# Patient Record
Sex: Male | Born: 1980
Health system: Southern US, Community
[De-identification: ages and names within clinical notes are randomized; demographics above are authoritative.]

## PROBLEM LIST (undated history)

## (undated) DIAGNOSIS — G8929 Other chronic pain: Secondary | ICD-10-CM

## (undated) DIAGNOSIS — F4310 Post-traumatic stress disorder, unspecified: Secondary | ICD-10-CM

## (undated) DIAGNOSIS — M25552 Pain in left hip: Secondary | ICD-10-CM

## (undated) DIAGNOSIS — J45909 Unspecified asthma, uncomplicated: Secondary | ICD-10-CM

## (undated) DIAGNOSIS — F32A Depression, unspecified: Secondary | ICD-10-CM

## (undated) DIAGNOSIS — F329 Major depressive disorder, single episode, unspecified: Secondary | ICD-10-CM

## (undated) DIAGNOSIS — M25551 Pain in right hip: Secondary | ICD-10-CM

## (undated) DIAGNOSIS — M810 Age-related osteoporosis without current pathological fracture: Secondary | ICD-10-CM

## (undated) DIAGNOSIS — F429 Obsessive-compulsive disorder, unspecified: Secondary | ICD-10-CM

## (undated) DIAGNOSIS — M9111 Juvenile osteochondrosis of head of femur [Legg-Calve-Perthes], right leg: Secondary | ICD-10-CM

## (undated) HISTORY — DX: Other chronic pain: G89.29

## (undated) HISTORY — PX: HIP ARTHROSCOPY W/ LABRAL REPAIR: SHX1750

## (undated) HISTORY — DX: Pain in left hip: M25.552

## (undated) HISTORY — DX: Obsessive-compulsive disorder, unspecified: F42.9

## (undated) HISTORY — DX: Juvenile osteochondrosis of head of femur (legg-calve-perthes), right leg: M91.11

## (undated) HISTORY — DX: Depression, unspecified: F32.A

## (undated) HISTORY — DX: Age-related osteoporosis without current pathological fracture: M81.0

## (undated) HISTORY — DX: Pain in right hip: M25.551

## (undated) HISTORY — DX: Post-traumatic stress disorder, unspecified: F43.10

---
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
## 2009-03-02 ENCOUNTER — Emergency Department: Payer: Self-pay | Admitting: Emergency Medicine

## 2009-03-07 ENCOUNTER — Ambulatory Visit: Payer: Self-pay | Admitting: Pain Medicine

## 2012-09-25 ENCOUNTER — Emergency Department: Payer: Self-pay | Admitting: Emergency Medicine

## 2015-04-05 ENCOUNTER — Emergency Department
Admission: EM | Admit: 2015-04-05 | Discharge: 2015-04-05 | Disposition: A | Discharge status: Home / Self Care | Payer: Medicare Other | Attending: Emergency Medicine | Admitting: Emergency Medicine

## 2015-04-05 DIAGNOSIS — K029 Dental caries, unspecified: Secondary | ICD-10-CM | POA: Insufficient documentation

## 2015-04-05 DIAGNOSIS — F17210 Nicotine dependence, cigarettes, uncomplicated: Secondary | ICD-10-CM | POA: Insufficient documentation

## 2015-04-05 DIAGNOSIS — K0381 Cracked tooth: Secondary | ICD-10-CM | POA: Diagnosis not present

## 2015-04-05 DIAGNOSIS — K0889 Other specified disorders of teeth and supporting structures: Secondary | ICD-10-CM | POA: Diagnosis present

## 2015-04-05 HISTORY — DX: Unspecified asthma, uncomplicated: J45.909

## 2015-04-05 MED ORDER — LIDOCAINE VISCOUS 2 % MT SOLN
15.0000 mL | Freq: Once | OROMUCOSAL | Status: AC
  Administered 2015-04-05: 15 mL via OROMUCOSAL
  Filled 2015-04-05: qty 15

## 2015-04-05 MED ORDER — IBUPROFEN 800 MG PO TABS: 800.0000 mg | ORAL_TABLET | Freq: Three times a day (TID) | ORAL | Status: DC | PRN

## 2015-04-05 MED ORDER — LIDOCAINE VISCOUS 2 % MT SOLN: 5.0000 mL | Freq: Four times a day (QID) | OROMUCOSAL | Status: DC | PRN

## 2015-04-05 MED ORDER — AMOXICILLIN 500 MG PO CAPS: 500.0000 mg | ORAL_CAPSULE | Freq: Three times a day (TID) | ORAL | Status: DC

## 2015-04-05 MED ORDER — TRAMADOL HCL 50 MG PO TABS
50.0000 mg | ORAL_TABLET | Freq: Once | ORAL | Status: AC
Start: 2015-04-05 — End: 2015-04-05
  Administered 2015-04-05: 50 mg via ORAL
  Filled 2015-04-05: qty 1

## 2015-04-05 MED ORDER — TRAMADOL HCL 50 MG PO TABS: 50.0000 mg | ORAL_TABLET | Freq: Four times a day (QID) | ORAL | Status: AC | PRN

## 2015-04-05 MED ORDER — IBUPROFEN 800 MG PO TABS
800.0000 mg | ORAL_TABLET | Freq: Once | ORAL | Status: DC
  Filled 2015-04-05: qty 1

## 2015-04-05 NOTE — Discharge Instructions (Signed)

## 2015-04-05 NOTE — ED Provider Notes (Signed)
[facility] Emergency Department Provider Note  ____________________________________________  Time seen: Approximately 2:16 PM  I have reviewed the triage vital signs and the nursing notes.   HISTORY  Chief Complaint Dental Pain    HPI Charles Harrison is a 35 y.o. male patient complaining of dental pain for 4-5 days. Patient has history of dental caries and fractured tooth to the left. Lower molars. Patient denies any fever associated this complaint. Patient stated he noticed some mild swelling to the left lateral mandible. No palliative measures taken this complaint. Patient wife said he has appointment next week for dentist. Patient rated his pain as a 7/10. Describes pain as sharp.   Past Medical History  Diagnosis Date  . Asthma     There are no active problems to display for this patient.   Past Surgical History  Procedure Laterality Date  . Hip arthroscopy w/ labral repair      BL    Current Outpatient Rx  Name  Route  Sig  Dispense  Refill  . amoxicillin (AMOXIL) 500 MG capsule   Oral   Take 1 capsule (500 mg total) by mouth 3 (three) times daily.   30 capsule   0   . ibuprofen (ADVIL,MOTRIN) 800 MG tablet   Oral   Take 1 tablet (800 mg total) by mouth every 8 (eight) hours as needed.   30 tablet   0   . lidocaine (XYLOCAINE) 2 % solution   Mouth/Throat   Use as directed 5 mLs in the mouth or throat every 6 (six) hours as needed for mouth pain. For oral swish   100 mL   0   . traMADol (ULTRAM) 50 MG tablet   Oral   Take 1 tablet (50 mg total) by mouth every 6 (six) hours as needed.   20 tablet   0     Allergies Review of patient's allergies indicates no known allergies.  No family history on file.  Social History Social History  Substance Use Topics  . Smoking status: Current Every Day Smoker    Types: Cigarettes  . Smokeless tobacco: None  . Alcohol Use: No    Review of Systems Constitutional: No  fever/chills Eyes: No visual changes. ENT: No sore throat. Dental pain Cardiovascular: Denies chest pain. Respiratory: Denies shortness of breath. Gastrointestinal: No abdominal pain.  No nausea, no vomiting.  No diarrhea.  No constipation. Genitourinary: Negative for dysuria. Musculoskeletal: Negative for back pain. Skin: Negative for rash. Neurological: Negative for headaches, focal weakness or numbness. .  ____________________________________________   PHYSICAL EXAM:  VITAL SIGNS: ED Triage Vitals  Enc Vitals Group     BP 04/05/15 1338 136/91 mmHg     Pulse Rate 04/05/15 1338 64     Resp 04/05/15 1338 17     Temp 04/05/15 1338 98 F (36.7 C)     Temp Source 04/05/15 1338 Oral     SpO2 04/05/15 1338 99 %     Weight 04/05/15 1338 145 lb (65.772 kg)     Height 04/05/15 1338 5\' 7"  (1.702 m)     Head Cir --      Peak Flow --      Pain Score 04/05/15 1339 7     Pain Loc --      Pain Edu? --      Excl. in GC? --     Constitutional: Alert and oriented. Well appearing and in no acute distress. Eyes: Conjunctivae are normal. PERRL. EOMI. Head:  Atraumatic. Nose: No congestion/rhinnorhea. Mouth/Throat: Mucous membranes are moist.  Oropharynx non-erythematous. Fractured tooth #17. Devitalize tooth #18 and 19. Neck: No stridor.  No cervical spine tenderness to palpation. Hematological/Lymphatic/Immunilogical: No cervical lymphadenopathy. Cardiovascular: Normal rate, regular rhythm. Grossly normal heart sounds.  Good peripheral circulation. Respiratory: Normal respiratory effort.  No retractions. Lungs CTAB. Gastrointestinal: Soft and nontender. No distention. No abdominal bruits. No CVA tenderness. Musculoskeletal: No lower extremity tenderness nor edema.  No joint effusions. Neurologic:  Normal speech and language. No gross focal neurologic deficits are appreciated. No gait instability. Skin:  Skin is warm, dry and intact. No rash noted. Psychiatric: Mood and affect are  normal. Speech and behavior are normal.  ____________________________________________   LABS (all labs ordered are listed, but only abnormal results are displayed)  Labs Reviewed - No data to display ____________________________________________  EKG   ____________________________________________  RADIOLOGY   ____________________________________________   PROCEDURES  Procedure(s) performed: None  Critical Care performed: No  ____________________________________________   INITIAL IMPRESSION / ASSESSMENT AND PLAN / ED COURSE  Pertinent labs & imaging results that were available during my care of the patient were reviewed by me and considered in my medical decision making (see chart for details).  Dental pain secondary to tooth fracture and devitalized teeth. Patient given a prescription for tramadol, ibuprofen, amoxicillin. Patient advised to follow-up with scheduled an appointment next week. ____________________________________________   FINAL CLINICAL IMPRESSION(S) / ED DIAGNOSES  Final diagnoses:  Pain due to dental caries      Joni Reining, PA-C 04/05/15 1422  Sharyn Creamer, MD 04/05/15 1544

## 2015-04-05 NOTE — ED Notes (Signed)
Pt c/o left lower toothache for the past 4-5 days

## 2017-04-05 ENCOUNTER — Emergency Department
Admission: EM | Admit: 2017-04-05 | Discharge: 2017-04-05 | Disposition: A | Discharge status: Home / Self Care | Payer: Medicare Other | Attending: Emergency Medicine | Admitting: Emergency Medicine

## 2017-04-05 ENCOUNTER — Encounter: Payer: Self-pay | Admitting: Emergency Medicine

## 2017-04-05 ENCOUNTER — Other Ambulatory Visit: Payer: Self-pay

## 2017-04-05 DIAGNOSIS — N4829 Other inflammatory disorders of penis: Secondary | ICD-10-CM | POA: Diagnosis not present

## 2017-04-05 DIAGNOSIS — F17210 Nicotine dependence, cigarettes, uncomplicated: Secondary | ICD-10-CM | POA: Diagnosis not present

## 2017-04-05 DIAGNOSIS — N4889 Other specified disorders of penis: Secondary | ICD-10-CM | POA: Insufficient documentation

## 2017-04-05 DIAGNOSIS — J45909 Unspecified asthma, uncomplicated: Secondary | ICD-10-CM | POA: Insufficient documentation

## 2017-04-05 LAB — URINALYSIS, COMPLETE (UACMP) WITH MICROSCOPIC
BACTERIA UA: NONE SEEN
BILIRUBIN URINE: NEGATIVE
Glucose, UA: NEGATIVE mg/dL
Hgb urine dipstick: NEGATIVE
Ketones, ur: NEGATIVE mg/dL
LEUKOCYTES UA: NEGATIVE
Nitrite: NEGATIVE
PROTEIN: NEGATIVE mg/dL
RBC / HPF: NONE SEEN RBC/hpf (ref 0–5)
Specific Gravity, Urine: 1.012 (ref 1.005–1.030)
pH: 6 (ref 5.0–8.0)

## 2017-04-05 NOTE — Discharge Instructions (Signed)
Please follow up with urology as we discussed. Avoid intercourse/masturbation/erections until pain has resolved

## 2017-04-05 NOTE — ED Provider Notes (Signed)
[facility] Emergency Department Provider Note   ____________________________________________    I have reviewed the triage vital signs and the nursing notes.   HISTORY  Chief Complaint Penis pain    HPI Charles Harrison is a 37 y.o. male who reports that 5 days ago he was wearing a "penis ring "to sustain an erection and apparently fell asleep without taking it off.  He woke up several hours later with mild swelling to the shaft of the penis.  He was able to get the ring off.  Since then he has had mild burning with urination and some soreness with erections.  No hematuria noted.  No penile discharge.  No significant swelling or bruising of the penis.  He is able to sustain an erection although it is uncomfortable  Past Medical History:  Diagnosis Date  . Asthma     There are no active problems to display for this patient.   Past Surgical History:  Procedure Laterality Date  . HIP ARTHROSCOPY W/ LABRAL REPAIR     BL    Prior to Admission medications   Medication Sig Start Date End Date Taking? Authorizing Provider  amoxicillin (AMOXIL) 500 MG capsule Take 1 capsule (500 mg total) by mouth 3 (three) times daily. 04/05/15   Joni ReiningSmith, Ronald K, PA-C  ibuprofen (ADVIL,MOTRIN) 800 MG tablet Take 1 tablet (800 mg total) by mouth every 8 (eight) hours as needed. 04/05/15   Joni ReiningSmith, Ronald K, PA-C  lidocaine (XYLOCAINE) 2 % solution Use as directed 5 mLs in the mouth or throat every 6 (six) hours as needed for mouth pain. For oral swish 04/05/15   Joni ReiningSmith, Ronald K, PA-C     Allergies Patient has no known allergies.  No family history on file.  Social History Social History   Tobacco Use  . Smoking status: Current Every Day Smoker    Types: Cigarettes  . Smokeless tobacco: Never Used  Substance Use Topics  . Alcohol use: No  . Drug use: Not on file    Review of Systems  Constitutional: No fever/chills Eyes: No visual changes.  ENT: No sore  throat. Cardiovascular: Denies palpitations Respiratory: Denies shortness of breath. Gastrointestinal: No abdominal pain.  Genitourinary: As above Musculoskeletal: Negative for back pain. Skin: Negative for bruising Neurological: Negative for numbness   ____________________________________________   PHYSICAL EXAM:  VITAL SIGNS: ED Triage Vitals  Enc Vitals Group     BP 04/05/17 1131 118/71     Pulse Rate 04/05/17 1131 86     Resp 04/05/17 1131 16     Temp 04/05/17 1131 98.1 F (36.7 C)     Temp Source 04/05/17 1131 Oral     SpO2 04/05/17 1131 99 %     Weight 04/05/17 1124 68 kg (150 lb)     Height 04/05/17 1124 1.676 m (5\' 6" )     Head Circumference --      Peak Flow --      Pain Score 04/05/17 1123 0     Pain Loc --      Pain Edu? --      Excl. in GC? --     Constitutional: Alert and oriented. No acute distress. Pleasant and interactive  Nose: No congestion/rhinnorhea.  Cardiovascular: Normal rate, regular rhythm.   Good peripheral circulation. Respiratory: Normal respiratory effort.  No retractions.  Gastrointestinal: Soft and nontender. No distention.  No CVA tenderness. Genitourinary: Shaft of penis not swollen, no bruising, no penile discharge, scrotum normal  musculoskeletal:   Warm and well perfused Neurologic:  Normal speech and language. No gross focal neurologic deficits are appreciated.  Skin:  Skin is warm, dry and intact. No rash noted. Psychiatric: Mood and affect are normal. Speech and behavior are normal.  ____________________________________________   LABS (all labs ordered are listed, but only abnormal results are displayed)  Labs Reviewed  URINALYSIS, COMPLETE (UACMP) WITH MICROSCOPIC - Abnormal; Notable for the following components:      Result Value   Color, Urine YELLOW (*)    APPearance CLEAR (*)    Squamous Epithelial / LPF 0-5 (*)    All other components within normal limits    ____________________________________________  EKG  None ____________________________________________  RADIOLOGY  None ____________________________________________   PROCEDURES  Procedure(s) performed: No  Procedures   Critical Care performed: No ____________________________________________   INITIAL IMPRESSION / ASSESSMENT AND PLAN / ED COURSE  Pertinent labs & imaging results that were available during my care of the patient were reviewed by me and considered in my medical decision making (see chart for details).  Exam is overall reassuring.  Suspect mild inflammation of the pain is causing his pain will refer him to urology for thorough evaluation..  Urinalysis unremarkable   ____________________________________________   FINAL CLINICAL IMPRESSION(S) / ED DIAGNOSES  Final diagnoses:  Inflammation of penis        Note:  This document was prepared using Dragon voice recognition software and may include unintentional dictation errors.    Jene Every, MD 04/05/17 1401
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
## 2017-04-05 NOTE — ED Triage Notes (Signed)
States accidentally fell asleep with a penis ring on (last Wednesday January 9th).  Since that time patient c/o discomfort with urination, difficult to "push out urine",and pain with erection.  States since injury he has not been able to have a full erection.

## 2018-05-27 ENCOUNTER — Encounter: Payer: Self-pay | Admitting: Family Medicine

## 2018-05-27 ENCOUNTER — Ambulatory Visit (INDEPENDENT_AMBULATORY_CARE_PROVIDER_SITE_OTHER): Payer: Medicare Other | Admitting: Family Medicine

## 2018-05-27 ENCOUNTER — Encounter (INDEPENDENT_AMBULATORY_CARE_PROVIDER_SITE_OTHER): Payer: Self-pay

## 2018-05-27 VITALS — BP 90/60 | HR 82 | Temp 98.2°F | Ht 67.0 in | Wt 161.4 lb

## 2018-05-27 DIAGNOSIS — B372 Candidiasis of skin and nail: Secondary | ICD-10-CM | POA: Diagnosis not present

## 2018-05-27 DIAGNOSIS — L0291 Cutaneous abscess, unspecified: Secondary | ICD-10-CM | POA: Diagnosis not present

## 2018-05-27 MED ORDER — FLUCONAZOLE 150 MG PO TABS: 150.0000 mg | ORAL_TABLET | Freq: Every day | ORAL | 0 refills | Status: AC

## 2018-05-27 MED ORDER — SULFAMETHOXAZOLE-TRIMETHOPRIM 800-160 MG PO TABS: 1.0000 | ORAL_TABLET | Freq: Two times a day (BID) | ORAL | 0 refills | Status: DC

## 2018-05-27 NOTE — Progress Notes (Signed)
Subjective:    Patient ID: Charles Harrison, male    DOB: Apr 03, 1980, 38 y.o.   MRN: [id number]  HPI  Patient presents to clinic to establish with PCP.  Main concern today is infection of skin on left buttock, and also a rash/jock itch in his groin.  Denies fever or chills.  States he has had the abscess on his buttock flareup before, it has drained pus, and then would go away.  States this time it did drain some pus yesterday, but skin has continued to remain red and sore.  Patient has had rash in groin off and on for many years.  Has tried different over-the-counter antifungal creams on occasion, but nothing consistently.  Does not use powder.  Past medical, surgical, family, social history reviewed and updated accordingly in chart.  Past Medical History:  Diagnosis Date  . Asthma   . PTSD (post-traumatic stress disorder)    There are no active problems to display for this patient.  Social History   Tobacco Use  . Smoking status: Former Smoker    Last attempt to quit: 07/15/2017    Years since quitting: 0.8  . Smokeless tobacco: Never Used  Substance Use Topics  . Alcohol use: No   Past Surgical History:  Procedure Laterality Date  . HIP ARTHROSCOPY W/ LABRAL REPAIR     BL   History reviewed. No pertinent family history.  Review of Systems  Constitutional: Negative for chills, fatigue and fever.  HENT: Negative for congestion, ear pain, sinus pain and sore throat.   Eyes: Negative.   Respiratory: Negative for cough, shortness of breath and wheezing.   Cardiovascular: Negative for chest pain, palpitations and leg swelling.  Gastrointestinal: Negative for abdominal pain, diarrhea, nausea and vomiting.  Genitourinary: Negative for dysuria, frequency and urgency.  Musculoskeletal: Negative for arthralgias and myalgias.  Skin: +cyst on buttock. +jock itch/rash Neurological: Negative for syncope, light-headedness and headaches.  Psychiatric/Behavioral: The patient is not  nervous/anxious.       Objective:   Physical Exam  Constitutional: He appears well-developed and well-nourished. No distress.  HENT:  Head: Normocephalic and atraumatic.  Eyes: Pupils are equal, round, and reactive to light. Conjunctivae and EOM are normal. No scleral icterus.  Neck: Normal range of motion. Neck supple. No tracheal deviation present.  Cardiovascular: Normal rate, regular rhythm and normal heart sounds.  Pulmonary/Chest: Effort normal and breath sounds normal. No respiratory distress. He has no wheezes. He has no rales.  Abdominal: Soft. Bowel sounds are normal. There is no tenderness.  Neurological: He is alert and oriented to person, place, and time.  Gait normal  Skin: Small abscess on left buttock cheek, skin is slightly red, small opening draining clear fluid.  Fungal type rash in bilateral groin area. Psychiatric: He has a normal mood and affect. His behavior is normal. Thought content normal.   Nursing note and vitals reviewed.    Today's Vitals   05/27/18 1414  BP: 90/60  Pulse: 82  Temp: 98.2 F (36.8 C)  TempSrc: Oral  Weight: 161 lb 6.4 oz (73.2 kg)  Height: 5\' 7"  (1.702 m)   Body mass index is 25.28 kg/m.     Assessment & Plan:   Yeast infection of skin in groin- patient's rash in groin appears consistent with a fungal yeast type rash.  Patient will use over-the-counter ketoconazole cream twice daily for 1 week, then switch over to antifungal powder such as Goldbond.  He will also take oral Diflucan course.  Advised to wear cotton underwear.  Abscess of skin on left buttock- patient will take Bactrim twice daily for 10 days.  Abscess area should clear up.  If it recurs, we discussed possibility of needing general surgery referral for cyst sac removal.  Patient will follow-up here in approximately 2 weeks for complete physical exam with fasting blood work.  Patient is aware he can return to clinic sooner if any issues arise.

## 2018-05-31 ENCOUNTER — Encounter: Payer: Self-pay | Admitting: Family Medicine

## 2018-06-10 ENCOUNTER — Encounter: Payer: Self-pay | Admitting: Family Medicine

## 2018-06-10 ENCOUNTER — Ambulatory Visit (INDEPENDENT_AMBULATORY_CARE_PROVIDER_SITE_OTHER): Payer: Medicare Other | Admitting: Family Medicine

## 2018-06-10 ENCOUNTER — Other Ambulatory Visit: Payer: Self-pay

## 2018-06-10 VITALS — BP 102/68 | HR 79 | Temp 98.2°F | Resp 16 | Ht 67.0 in | Wt 161.2 lb

## 2018-06-10 DIAGNOSIS — Z1329 Encounter for screening for other suspected endocrine disorder: Secondary | ICD-10-CM

## 2018-06-10 DIAGNOSIS — F4310 Post-traumatic stress disorder, unspecified: Secondary | ICD-10-CM | POA: Diagnosis not present

## 2018-06-10 DIAGNOSIS — L0291 Cutaneous abscess, unspecified: Secondary | ICD-10-CM

## 2018-06-10 DIAGNOSIS — Z0000 Encounter for general adult medical examination without abnormal findings: Secondary | ICD-10-CM | POA: Diagnosis not present

## 2018-06-10 DIAGNOSIS — M25552 Pain in left hip: Secondary | ICD-10-CM

## 2018-06-10 DIAGNOSIS — B372 Candidiasis of skin and nail: Secondary | ICD-10-CM | POA: Diagnosis not present

## 2018-06-10 DIAGNOSIS — M9111 Juvenile osteochondrosis of head of femur [Legg-Calve-Perthes], right leg: Secondary | ICD-10-CM | POA: Insufficient documentation

## 2018-06-10 DIAGNOSIS — Z13220 Encounter for screening for lipoid disorders: Secondary | ICD-10-CM | POA: Diagnosis not present

## 2018-06-10 DIAGNOSIS — M25551 Pain in right hip: Secondary | ICD-10-CM

## 2018-06-10 DIAGNOSIS — G8929 Other chronic pain: Secondary | ICD-10-CM | POA: Insufficient documentation

## 2018-06-10 LAB — LIPID PANEL
CHOLESTEROL: 216 mg/dL — AB (ref 0–200)
HDL: 42.9 mg/dL (ref >39.00)
LDL CALC: 143 mg/dL — AB (ref 0–99)
NonHDL: 172.78
TRIGLYCERIDES: 151 mg/dL — AB (ref 0.0–149.0)
Total CHOL/HDL Ratio: 5
VLDL: 30.2 mg/dL (ref 0.0–40.0)

## 2018-06-10 LAB — CBC
HEMATOCRIT: 41.8 % (ref 39.0–52.0)
HEMOGLOBIN: 14.5 g/dL (ref 13.0–17.0)
MCHC: 34.8 g/dL (ref 30.0–36.0)
MCV: 97.4 fl (ref 78.0–100.0)
Platelets: 244 10*3/uL (ref 150.0–400.0)
RBC: 4.29 Mil/uL (ref 4.22–5.81)
RDW: 12.8 % (ref 11.5–15.5)
WBC: 4.9 10*3/uL (ref 4.0–10.5)

## 2018-06-10 LAB — COMPREHENSIVE METABOLIC PANEL
ALBUMIN: 4.4 g/dL (ref 3.5–5.2)
ALK PHOS: 62 U/L (ref 39–117)
ALT: 32 U/L (ref 0–53)
AST: 22 U/L (ref 0–37)
BUN: 13 mg/dL (ref 6–23)
CALCIUM: 8.9 mg/dL (ref 8.4–10.5)
CHLORIDE: 103 meq/L (ref 96–112)
CO2: 24 mEq/L (ref 19–32)
Creatinine, Ser: 1 mg/dL (ref 0.40–1.50)
GFR: 83.38 mL/min (ref >60.00)
Glucose, Bld: 109 mg/dL — ABNORMAL HIGH (ref 70–99)
POTASSIUM: 3.5 meq/L (ref 3.5–5.1)
SODIUM: 136 meq/L (ref 135–145)
TOTAL PROTEIN: 7.3 g/dL (ref 6.0–8.3)
Total Bilirubin: 0.5 mg/dL (ref 0.2–1.2)

## 2018-06-10 NOTE — Progress Notes (Signed)
Subjective:    Patient ID: Charles Harrison, male    DOB: 08/22/1980, 38 y.o.   MRN: [id number]   HPI  Patient resents to clinic for complete physical exam and also for follow-up on abscess and yeast infection of skin.  Patient has completed Bactrim course, abscess on buttock cheeks has completely resolved.  Patient followed my advice and use over-the-counter ketoconazole cream twice daily for 1 week and then switched over to antifungal powder Goldbond, and that this has worked Adult nurse to help keep groin rash under control.  He tolerated the short Diflucan course without any issues.  Patient is pleased the yeast infection/groin rash improved.  Otherwise patient is feeling well, reports no new issues.  Past medical, social, surgical and family history again reviewed and updated accordingly in chart.  Past Medical History:  Diagnosis Date  . Asthma   . Chronic pain of both hips   . Legg-Calve-Perthes disease, right   . PTSD (post-traumatic stress disorder)    Patient Active Problem List   Diagnosis Date Noted  . PTSD (post-traumatic stress disorder)   . Chronic pain of both hips   . Legg-Calve-Perthes disease, right    Social History   Tobacco Use  . Smoking status: Former Smoker    Last attempt to quit: 07/15/2017    Years since quitting: 0.9  . Smokeless tobacco: Never Used  Substance Use Topics  . Alcohol use: No   Family History  Family history unknown: Yes    Review of Systems  Constitutional: Negative for chills, fatigue and fever.  HENT: Negative for congestion, ear pain, sinus pain and sore throat.   Eyes: Negative.   Respiratory: Negative for cough, shortness of breath and wheezing.   Cardiovascular: Negative for chest pain, palpitations and leg swelling.  Gastrointestinal: Negative for abdominal pain, diarrhea, nausea and vomiting.  Genitourinary: Negative for dysuria, frequency and urgency.  Musculoskeletal: Negative for arthralgias and myalgias.  Skin:  Negative for color change, pallor and rash.  Neurological: Negative for syncope, light-headedness and headaches.  Psychiatric/Behavioral: The patient is not nervous/anxious.       Objective:   Physical Exam Vitals signs and nursing note reviewed.  Constitutional:      General: He is not in acute distress.    Appearance: He is not toxic-appearing.  HENT:     Head: Normocephalic and atraumatic.     Right Ear: Tympanic membrane, ear canal and external ear normal. There is no impacted cerumen.     Left Ear: Tympanic membrane, ear canal and external ear normal. There is no impacted cerumen.     Nose: Nose normal.     Mouth/Throat:     Mouth: Mucous membranes are moist.     Pharynx: Oropharynx is clear.  Eyes:     General: No scleral icterus.    Extraocular Movements: Extraocular movements intact.     Pupils: Pupils are equal, round, and reactive to light.  Neck:     Musculoskeletal: Normal range of motion and neck supple. No neck rigidity.  Cardiovascular:     Rate and Rhythm: Normal rate and regular rhythm.     Heart sounds: Normal heart sounds.  Pulmonary:     Effort: Pulmonary effort is normal. No respiratory distress.     Breath sounds: Normal breath sounds. No wheezing, rhonchi or rales.  Abdominal:     General: Bowel sounds are normal. There is no distension.     Palpations: Abdomen is soft.     Tenderness: There  is no abdominal tenderness. There is no guarding or rebound.     Hernia: No hernia is present.  Genitourinary:    Penis: Normal.      Scrotum/Testes: Normal.  Skin:    General: Skin is warm and dry.     Coloration: Skin is not jaundiced or pale.     Findings: No erythema or rash.     Comments: Abscess on buttock resolved Rash in groin resolved  Neurological:     Mental Status: He is alert and oriented to person, place, and time.     Comments: RLE weakness, chronic issue, on disability  Psychiatric:        Mood and Affect: Mood normal.        Behavior:  Behavior normal.    Today's Vitals   06/10/18 1042  BP: 102/68  Pulse: 79  Resp: 16  Temp: 98.2 F (36.8 C)  TempSrc: Oral  SpO2: 98%  Weight: 161 lb 3.2 oz (73.1 kg)  Height: 5\' 7"  (1.702 m)   Body mass index is 25.25 kg/m.     Depression screen PHQ 2/9 06/10/2018  Decreased Interest 0  Down, Depressed, Hopeless 0  PHQ - 2 Score 0    Assessment & Plan:    Well adult exam, lipid screen, thyroid screen-we will get blood work including CBC, CMP, lipid panel thyroid panel.  Discussed healthy diet full of lean proteins, lots of vegetables, carbs in moderation, plenty of water and encouraged regular physical activity including walking and small exercises he can do at home to keep himself physically active.  Patient always wears seatbelt when in car.  Recommended he see dentist twice per year and see eye doctor at least every 2 years.  Discussed safe sun practices including wearing wide brim hat, long sleeves and SPF of at least 30 when outdoors for extended period of time.  Abscess-abscess on buttocks has resolved completely after Bactrim course.  Patient will monitor for any recurrence.  Skin yeast infection-rash in groin area has resolved after use of ketoconazole cream for 1 week then switching over to Goldbond topical powder.  Encouraged to continue use of powder in the groin area to avoid sweat buildup which in turn often will lead to a fungal type rash.  Patient will return to clinic annually for wellness exam, and return to clinic sooner if any illness or issues arise.  He is aware he can call office at any time if needed.

## 2018-06-11 LAB — THYROID PANEL WITH TSH
Free Thyroxine Index: 2.4 (ref 1.4–3.8)
T3 UPTAKE: 32 % (ref 22–35)
T4 TOTAL: 7.4 ug/dL (ref 4.9–10.5)
TSH: 2.29 mIU/L (ref 0.40–4.50)

## 2018-06-16 ENCOUNTER — Encounter: Payer: Self-pay | Admitting: Lab

## 2018-06-21 ENCOUNTER — Telehealth: Payer: Self-pay | Admitting: Family Medicine

## 2018-06-21 NOTE — Telephone Encounter (Signed)
Pt given results as noted below, patient verbalized understanding. Unable to document in result note.   Notes recorded by Tracey Harries, FNP on 06/14/2018 at 8:57 AM EDT Complete blood count is normal. Thyroid normal. Cholesterol is a little elevated, work on good diet and regular exercise to help improve cholesterol. Electrolytes, liver and kidney normal

## 2019-03-25 ENCOUNTER — Other Ambulatory Visit: Payer: Self-pay

## 2019-03-25 ENCOUNTER — Emergency Department
Admission: EM | Admit: 2019-03-25 | Discharge: 2019-03-25 | Disposition: A | Discharge status: Home / Self Care | Payer: Medicare Other | Attending: Emergency Medicine | Admitting: Emergency Medicine

## 2019-03-25 ENCOUNTER — Encounter: Payer: Self-pay | Admitting: Emergency Medicine

## 2019-03-25 DIAGNOSIS — Z87891 Personal history of nicotine dependence: Secondary | ICD-10-CM | POA: Diagnosis not present

## 2019-03-25 DIAGNOSIS — Z20822 Contact with and (suspected) exposure to covid-19: Secondary | ICD-10-CM | POA: Diagnosis not present

## 2019-03-25 DIAGNOSIS — Z79899 Other long term (current) drug therapy: Secondary | ICD-10-CM | POA: Diagnosis not present

## 2019-03-25 DIAGNOSIS — J029 Acute pharyngitis, unspecified: Secondary | ICD-10-CM | POA: Diagnosis present

## 2019-03-25 DIAGNOSIS — J069 Acute upper respiratory infection, unspecified: Secondary | ICD-10-CM | POA: Insufficient documentation

## 2019-03-25 DIAGNOSIS — J45909 Unspecified asthma, uncomplicated: Secondary | ICD-10-CM | POA: Diagnosis not present

## 2019-03-25 DIAGNOSIS — B9789 Other viral agents as the cause of diseases classified elsewhere: Secondary | ICD-10-CM | POA: Diagnosis not present

## 2019-03-25 LAB — SARS CORONAVIRUS 2 (TAT 6-24 HRS): SARS Coronavirus 2: NEGATIVE

## 2019-03-25 MED ORDER — CETIRIZINE HCL 10 MG PO TABS: 10.0000 mg | ORAL_TABLET | Freq: Every day | ORAL | 2 refills | Status: AC

## 2019-03-25 MED ORDER — FLUTICASONE PROPIONATE 50 MCG/ACT NA SUSP: 1.0000 | Freq: Every day | NASAL | 2 refills | Status: AC

## 2019-03-25 MED ORDER — ALBUTEROL SULFATE HFA 108 (90 BASE) MCG/ACT IN AERS: 2.0000 | INHALATION_SPRAY | Freq: Four times a day (QID) | RESPIRATORY_TRACT | 1 refills | Status: AC | PRN

## 2019-03-25 NOTE — ED Triage Notes (Signed)
Sore throat and cough today. States daughter has COVID.

## 2019-03-25 NOTE — ED Provider Notes (Signed)
Emergency Department Provider Note  ____________________________________________  Time seen: Approximately 3:58 PM  I have reviewed the triage vital signs and the nursing notes.   HISTORY  Chief Complaint Sore Throat and Cough   Historian Patient    HPI Charles Harrison is a 39 y.o. male with a history of asthma and chronic pain, presents to the emergency department with pharyngitis, dry nonproductive cough and malaise that started today.  Patient's daughter recently tested positive for COVID-19.  He denies chest pain abdominal pain, diarrhea or vomiting.  He has been afebrile at home.  No associated nasal congestion or rhinorrhea.  No other alleviating measures have been attempted.   Past Medical History:  Diagnosis Date  . Asthma   . Chronic pain of both hips   . Legg-Calve-Perthes disease, right   . PTSD (post-traumatic stress disorder)      Immunizations up to date:  Yes.     Past Medical History:  Diagnosis Date  . Asthma   . Chronic pain of both hips   . Legg-Calve-Perthes disease, right   . PTSD (post-traumatic stress disorder)     Patient Active Problem List   Diagnosis Date Noted  . PTSD (post-traumatic stress disorder)   . Chronic pain of both hips   . Legg-Calve-Perthes disease, right     Past Surgical History:  Procedure Laterality Date  . HIP ARTHROSCOPY W/ LABRAL REPAIR     BL    Prior to Admission medications   Medication Sig Start Date End Date Taking? Authorizing Provider  albuterol (VENTOLIN HFA) 108 (90 Base) MCG/ACT inhaler Inhale 2 puffs into the lungs every 6 (six) hours as needed for wheezing or shortness of breath. 03/25/19   Lannie Fields, PA-C  amoxicillin (AMOXIL) 500 MG capsule Take 1 capsule (500 mg total) by mouth 3 (three) times daily. 04/05/15   Sable Feil, PA-C  cetirizine (ZYRTEC ALLERGY) 10 MG tablet Take 1 tablet (10 mg total) by mouth daily. 03/25/19 03/24/20  Lannie Fields, PA-C  fluticasone (FLONASE) 50 MCG/ACT nasal  spray Place 1 spray into both nostrils daily. 03/25/19 03/24/20  Lannie Fields, PA-C  ibuprofen (ADVIL,MOTRIN) 800 MG tablet Take 1 tablet (800 mg total) by mouth every 8 (eight) hours as needed. 04/05/15   Sable Feil, PA-C  lidocaine (XYLOCAINE) 2 % solution Use as directed 5 mLs in the mouth or throat every 6 (six) hours as needed for mouth pain. For oral swish 04/05/15   Sable Feil, PA-C  sulfamethoxazole-trimethoprim (BACTRIM DS) 800-160 MG tablet Take 1 tablet by mouth 2 (two) times daily. 05/27/18   Jodelle Green, FNP    Allergies Patient has no known allergies.  Family History  Family history unknown: Yes    Social History Social History   Tobacco Use  . Smoking status: Former Smoker    Quit date: 07/15/2017    Years since quitting: 1.6  . Smokeless tobacco: Never Used  Substance Use Topics  . Alcohol use: No  . Drug use: Not on file      Review of Systems  Constitutional: Patient has been afebrile. Eyes: No visual changes. No discharge ENT: Patient has congestion.  Cardiovascular: no chest pain. Respiratory: Patient has cough.  Gastrointestinal: No abdominal pain.  No nausea, no vomiting. No diarrhea.  Genitourinary: Negative for dysuria. No hematuria Musculoskeletal: Patient has myalgias.  Skin: Negative for rash, abrasions, lacerations, ecchymosis. Neurological: No headache, no focal weakness or numbness.  ____________________________________________   PHYSICAL EXAM:  VITAL SIGNS: ED Triage Vitals  Enc Vitals Group     BP 03/25/19 1330 111/71     Pulse Rate 03/25/19 1330 72     Resp 03/25/19 1330 20     Temp 03/25/19 1330 (!) 97.1 F (36.2 C)     Temp Source 03/25/19 1330 Oral     SpO2 03/25/19 1330 97 %     Weight 03/25/19 1331 165 lb (74.8 kg)     Height 03/25/19 1331 5\' 7"  (1.702 m)     Head Circumference --      Peak Flow --      Pain Score 03/25/19 1331 5     Pain Loc --      Pain Edu? --      Excl. in GC? --      Constitutional:  Alert and oriented. Patient is lying supine. Eyes: Conjunctivae are normal. PERRL. EOMI. Head: Atraumatic. ENT:      Ears: Tympanic membranes are mildly injected with mild effusion bilaterally.       Nose: No congestion/rhinnorhea.      Mouth/Throat: Mucous membranes are moist. Posterior pharynx is mildly erythematous.  Hematological/Lymphatic/Immunilogical: No cervical lymphadenopathy.  Cardiovascular: Normal rate, regular rhythm. Normal S1 and S2.  Good peripheral circulation. Respiratory: Normal respiratory effort without tachypnea or retractions. Lungs CTAB. Good air entry to the bases with no decreased or absent breath sounds. Gastrointestinal: Bowel sounds 4 quadrants. Soft and nontender to palpation. No guarding or rigidity. No palpable masses. No distention. No CVA tenderness. Musculoskeletal: Full range of motion to all extremities. No gross deformities appreciated. Neurologic:  Normal speech and language. No gross focal neurologic deficits are appreciated.  Skin:  Skin is warm, dry and intact. No rash noted. Psychiatric: Mood and affect are normal. Speech and behavior are normal. Patient exhibits appropriate insight and judgement.    ____________________________________________   LABS (all labs ordered are listed, but only abnormal results are displayed)  Labs Reviewed  SARS CORONAVIRUS 2 (TAT 6-24 HRS)   ____________________________________________  EKG   ____________________________________________  RADIOLOGY   No results found.  ____________________________________________    PROCEDURES  Procedure(s) performed:     Procedures     Medications - No data to display   ____________________________________________   INITIAL IMPRESSION / ASSESSMENT AND PLAN / ED COURSE  Pertinent labs & imaging results that were available during my care of the patient were reviewed by me and considered in my medical decision making (see chart for details).       Assessment and Plan: Unspecified viral URI 39 year old male presents to the emergency department with nonproductive cough and pharyngitis that started today.  Patient's daughter has been diagnosed with COVID-19.  Vital signs were reassuring at triage.  On physical exam, patient had no increased work of breathing and appeared to be resting comfortably.  COVID-19 testing is pending at this time.  Patient was discharged with albuterol, Flonase and Zyrtec.  Strict return precautions were given to return to the emergency department with new or worsening symptoms.  All patient questions were answered.  Kinneth Fujiwara was evaluated in Emergency Department on 03/25/2019 for the symptoms described in the history of present illness. He was evaluated in the context of the global COVID-19 pandemic, which necessitated consideration that the patient might be at risk for infection with the SARS-CoV-2 virus that causes COVID-19. Institutional protocols and algorithms that pertain to the evaluation of patients at risk for COVID-19 are in a state of rapid change based on information  released by regulatory bodies including the CDC and federal and state organizations. These policies and algorithms were followed during the patient's care in the ED.   ____________________________________________  FINAL CLINICAL IMPRESSION(S) / ED DIAGNOSES  Final diagnoses:  Viral upper respiratory tract infection      NEW MEDICATIONS STARTED DURING THIS VISIT:  ED Discharge Orders         Ordered    fluticasone (FLONASE) 50 MCG/ACT nasal spray  Daily     03/25/19 1556    albuterol (VENTOLIN HFA) 108 (90 Base) MCG/ACT inhaler  Every 6 hours PRN     03/25/19 1556    cetirizine (ZYRTEC ALLERGY) 10 MG tablet  Daily     03/25/19 1556              This chart was dictated using voice recognition software/Dragon. Despite best efforts to proofread, errors can occur which can change the meaning. Any change was purely  unintentional.     Orvil Feil, PA-C 03/25/19 1602    Sharman Cheek, MD 03/25/19 2249

## 2019-03-27 ENCOUNTER — Other Ambulatory Visit: Payer: Self-pay

## 2019-03-27 ENCOUNTER — Encounter: Payer: Self-pay | Admitting: Adult Health

## 2019-03-27 ENCOUNTER — Ambulatory Visit (INDEPENDENT_AMBULATORY_CARE_PROVIDER_SITE_OTHER): Payer: Medicare Other | Admitting: Adult Health

## 2019-03-27 VITALS — BP 142/78 | HR 87 | Temp 97.3°F | Resp 15 | Ht 67.0 in | Wt 162.0 lb

## 2019-03-27 DIAGNOSIS — M9111 Juvenile osteochondrosis of head of femur [Legg-Calve-Perthes], right leg: Secondary | ICD-10-CM | POA: Diagnosis not present

## 2019-03-27 DIAGNOSIS — M85851 Other specified disorders of bone density and structure, right thigh: Secondary | ICD-10-CM | POA: Diagnosis not present

## 2019-03-27 DIAGNOSIS — R030 Elevated blood-pressure reading, without diagnosis of hypertension: Secondary | ICD-10-CM

## 2019-03-27 DIAGNOSIS — L739 Follicular disorder, unspecified: Secondary | ICD-10-CM

## 2019-03-27 MED ORDER — DOXYCYCLINE HYCLATE 100 MG PO TABS: 100.0000 mg | ORAL_TABLET | Freq: Two times a day (BID) | ORAL | 0 refills | Status: DC

## 2019-03-27 NOTE — Patient Instructions (Signed)
Health Maintenance, Male Adopting a healthy lifestyle and getting preventive care are important in promoting health and wellness. Ask your health care provider about:  The right schedule for you to have regular tests and exams.  Things you can do on your own to prevent diseases and keep yourself healthy. What should I know about diet, weight, and exercise? Eat a healthy diet   Eat a diet that includes plenty of vegetables, fruits, low-fat dairy products, and lean protein.  Do not eat a lot of foods that are high in solid fats, added sugars, or sodium. Maintain a healthy weight Body mass index (BMI) is a measurement that can be used to identify possible weight problems. It estimates body fat based on height and weight. Your health care provider can help determine your BMI and help you achieve or maintain a healthy weight. Get regular exercise Get regular exercise. This is one of the most important things you can do for your health. Most adults should:  Exercise for at least 150 minutes each week. The exercise should increase your heart rate and make you sweat (moderate-intensity exercise).  Do strengthening exercises at least twice a week. This is in addition to the moderate-intensity exercise.  Spend less time sitting. Even light physical activity can be beneficial. Watch cholesterol and blood lipids Have your blood tested for lipids and cholesterol at 40 years of age, then have this test every 5 years. You may need to have your cholesterol levels checked more often if:  Your lipid or cholesterol levels are high.  You are older than 40 years of age.  You are at high risk for heart disease. What should I know about cancer screening? Many types of cancers can be detected early and may often be prevented. Depending on your health history and family history, you may need to have cancer screening at various ages. This may include screening for:  Colorectal cancer.  Prostate  cancer.  Skin cancer.  Lung cancer. What should I know about heart disease, diabetes, and high blood pressure? Blood pressure and heart disease  High blood pressure causes heart disease and increases the risk of stroke. This is more likely to develop in people who have high blood pressure readings, are of African descent, or are overweight.  Talk with your health care provider about your target blood pressure readings.  Have your blood pressure checked: ? Every 3-5 years if you are 18-39 years of age. ? Every year if you are 40 years old or older.  If you are between the ages of 65 and 75 and are a current or former smoker, ask your health care provider if you should have a one-time screening for abdominal aortic aneurysm (AAA). Diabetes Have regular diabetes screenings. This checks your fasting blood sugar level. Have the screening done:  Once every three years after age 45 if you are at a normal weight and have a low risk for diabetes.  More often and at a younger age if you are overweight or have a high risk for diabetes. What should I know about preventing infection? Hepatitis B If you have a higher risk for hepatitis B, you should be screened for this virus. Talk with your health care provider to find out if you are at risk for hepatitis B infection. Hepatitis C Blood testing is recommended for:  Everyone born from 1945 through 1965.  Anyone with known risk factors for hepatitis C. Sexually transmitted infections (STIs)  You should be screened each year   for STIs, including gonorrhea and chlamydia, if: ? You are sexually active and are younger than 40 years of age. ? You are older than 40 years of age and your health care provider tells you that you are at risk for this type of infection. ? Your sexual activity has changed since you were last screened, and you are at increased risk for chlamydia or gonorrhea. Ask your health care provider if you are at risk.  Ask your  health care provider about whether you are at high risk for HIV. Your health care provider may recommend a prescription medicine to help prevent HIV infection. If you choose to take medicine to prevent HIV, you should first get tested for HIV. You should then be tested every 3 months for as long as you are taking the medicine. Follow these instructions at home: Lifestyle  Do not use any products that contain nicotine or tobacco, such as cigarettes, e-cigarettes, and chewing tobacco. If you need help quitting, ask your health care provider.  Do not use street drugs.  Do not share needles.  Ask your health care provider for help if you need support or information about quitting drugs. Alcohol use  Do not drink alcohol if your health care provider tells you not to drink.  If you drink alcohol: ? Limit how much you have to 0-2 drinks a day. ? Be aware of how much alcohol is in your drink. In the [place] one drink equals one 12 oz bottle of beer (355 mL), one 5 oz glass of wine (148 mL), or one 1 oz glass of hard liquor (44 mL). General instructions  Schedule regular health, dental, and eye exams.  Stay current with your vaccines.  Tell your health care provider if: ? You often feel depressed. ? You have ever been abused or do not feel safe at home. Summary  Adopting a healthy lifestyle and getting preventive care are important in promoting health and wellness.  Follow your health care provider's instructions about healthy diet, exercising, and getting tested or screened for diseases.  Follow your health care provider's instructions on monitoring your cholesterol and blood pressure. This information is not intended to replace advice given to you by your health care provider. Make sure you discuss any questions you have with your health care provider. Document Revised: 03/02/2018 Document Reviewed: 03/02/2018 Elsevier Patient Education  [place]. Folliculitis  Folliculitis  is inflammation of the hair follicles. Folliculitis most commonly occurs on the scalp, thighs, legs, back, and buttocks. However, it can occur anywhere on the body. What are the causes? This condition may be caused by:  A bacterial infection (common).  A fungal infection.  A viral infection.  Contact with certain chemicals, especially oils and tars.  Shaving or waxing.  Greasy ointments or creams applied to the skin. Long-lasting folliculitis and folliculitis that keeps coming back may be caused by bacteria. This bacteria can live anywhere on your skin and is often found in the nostrils. What increases the risk? You are more likely to develop this condition if you have:  A weakened immune system.  Diabetes.  Obesity. What are the signs or symptoms? Symptoms of this condition include:  Redness.  Soreness.  Swelling.  Itching.  Small white or yellow, pus-filled, itchy spots (pustules) that appear over a reddened area. If there is an infection that goes deep into the follicle, these may develop into a boil (furuncle).  A group of closely packed boils (carbuncle). These tend to  form in hairy, sweaty areas of the body. How is this diagnosed? This condition is diagnosed with a skin exam. To find what is causing the condition, your health care provider may take a sample of one of the pustules or boils for testing in a lab. How is this treated? This condition may be treated by:  Applying warm compresses to the affected areas.  Taking an antibiotic medicine or applying an antibiotic medicine to the skin.  Applying or bathing with an antiseptic solution.  Taking an over-the-counter medicine to help with itching.  Having a procedure to drain any pustules or boils. This may be done if a pustule or boil contains a lot of pus or fluid.  Having laser hair removal. This may be done to treat long-lasting folliculitis. Follow these instructions at home: Managing pain and  swelling   If directed, apply heat to the affected area as often as told by your health care provider. Use the heat source that your health care provider recommends, such as a moist heat pack or a heating pad. ? Place a towel between your skin and the heat source. ? Leave the heat on for 20-30 minutes. ? Remove the heat if your skin turns bright red. This is especially important if you are unable to feel pain, heat, or cold. You may have a greater risk of getting burned. General instructions  If you were prescribed an antibiotic medicine, take it or apply it as told by your health care provider. Do not stop using the antibiotic even if your condition improves.  Check the irritated area every day for signs of infection. Check for: ? Redness, swelling, or pain. ? Fluid or blood. ? Warmth. ? Pus or a bad smell.  Do not shave irritated skin.  Take over-the-counter and prescription medicines only as told by your health care provider.  Keep all follow-up visits as told by your health care provider. This is important. Get help right away if:  You have more redness, swelling, or pain in the affected area.  Red streaks are spreading from the affected area.  You have a fever. Summary  Folliculitis is inflammation of the hair follicles. Folliculitis most commonly occurs on the scalp, thighs, legs, back, and buttocks.  This condition may be treated by taking an antibiotic medicine or applying an antibiotic medicine to the skin, and applying or bathing with an antiseptic solution.  If you were prescribed an antibiotic medicine, take it or apply it as told by your health care provider. Do not stop using the antibiotic even if your condition improves.  Get help right away if you have new or worsening symptoms.  Keep all follow-up visits as told by your health care provider. This is important. This information is not intended to replace advice given to you by your health care provider. Make  sure you discuss any questions you have with your health care provider. Document Revised: 10/16/2017 Document Reviewed: 10/16/2017 Elsevier Patient Education  [place]. Doxycycline tablets or capsules What is this medicine? DOXYCYCLINE (dox i SYE kleen) is a tetracycline antibiotic. It kills certain bacteria or stops their growth. It is used to treat many kinds of infections, like dental, skin, respiratory, and urinary tract infections. It also treats acne, Lyme disease, malaria, and certain sexually transmitted infections. This medicine may be used for other purposes; ask your health care provider or pharmacist if you have questions. COMMON BRAND NAME(S): Acticlate, Adoxa, Adoxa CK, Adoxa Pak, Adoxa TT, Alodox, Avidoxy, Doxal,  LYMEPAK, Mondoxyne NL, Monodox, Morgidox 1x, Morgidox 1x Kit, Morgidox 2x, Morgidox 2x Kit, NutriDox, Ocudox, [place], [place], Vibra-Tabs, Vibramycin What should I tell my health care provider before I take this medicine? They need to know if you have any of these conditions:  liver disease  long exposure to sunlight like working outdoors  stomach problems like colitis  an unusual or allergic reaction to doxycycline, tetracycline antibiotics, other medicines, foods, dyes, or preservatives  pregnant or trying to get pregnant  breast-feeding How should I use this medicine? Take this medicine by mouth with a full glass of water. Follow the directions on the prescription label. It is best to take this medicine without food, but if it upsets your stomach take it with food. Take your medicine at regular intervals. Do not take your medicine more often than directed. Take all of your medicine as directed even if you think you are better. Do not skip doses or stop your medicine early. Talk to your pediatrician regarding the use of this medicine in children. While this drug may be prescribed for selected conditions, precautions do apply. Overdosage: If you think you  have taken too much of this medicine contact a poison control center or emergency room at once. NOTE: This medicine is only for you. Do not share this medicine with others. What if I miss a dose? If you miss a dose, take it as soon as you can. If it is almost time for your next dose, take only that dose. Do not take double or extra doses. What may interact with this medicine?  antacids  barbiturates  birth control pills  bismuth subsalicylate  carbamazepine  methoxyflurane  other antibiotics  phenytoin  vitamins that contain iron  warfarin This list may not describe all possible interactions. Give your health care provider a list of all the medicines, herbs, non-prescription drugs, or dietary supplements you use. Also tell them if you smoke, drink alcohol, or use illegal drugs. Some items may interact with your medicine. What should I watch for while using this medicine? Tell your doctor or health care professional if your symptoms do not improve. Do not treat diarrhea with over the counter products. Contact your doctor if you have diarrhea that lasts more than 2 days or if it is severe and watery. Do not take this medicine just before going to bed. It may not dissolve properly when you lay down and can cause pain in your throat. Drink plenty of fluids while taking this medicine to also help reduce irritation in your throat. This medicine can make you more sensitive to the sun. Keep out of the sun. If you cannot avoid being in the sun, wear protective clothing and use sunscreen. Do not use sun lamps or tanning beds/booths. Birth control pills may not work properly while you are taking this medicine. Talk to your doctor about using an extra method of birth control. If you are being treated for a sexually transmitted infection, avoid sexual contact until you have finished your treatment. Your sexual partner may also need treatment. Avoid antacids, aluminum, calcium, magnesium, and iron  products for 4 hours before and 2 hours after taking a dose of this medicine. If you are using this medicine to prevent malaria, you should still protect yourself from contact with mosquitos. Stay in screened-in areas, use mosquito nets, keep your body covered, and use an insect repellent. What side effects may I notice from receiving this medicine? Side effects that you should report to your  doctor or health care professional as soon as possible:  allergic reactions like skin rash, itching or hives, swelling of the face, lips, or tongue  difficulty breathing  fever  itching in the rectal or genital area  pain on swallowing  rash, fever, and swollen lymph nodes  redness, blistering, peeling or loosening of the skin, including inside the mouth  severe stomach pain or cramps  unusual bleeding or bruising  unusually weak or tired  yellowing of the eyes or skin Side effects that usually do not require medical attention (report to your doctor or health care professional if they continue or are bothersome):  diarrhea  loss of appetite  nausea, vomiting This list may not describe all possible side effects. Call your doctor for medical advice about side effects. You may report side effects to FDA at 1-800-FDA-1088. Where should I keep my medicine? Keep out of the reach of children. Store at room temperature, below 30 degrees C (86 degrees F). Protect from light. Keep container tightly closed. Throw away any unused medicine after the expiration date. Taking this medicine after the expiration date can make you seriously ill. NOTE: This sheet is a summary. It may not cover all possible information. If you have questions about this medicine, talk to your doctor, pharmacist, or health care provider.  2020 Elsevier/Gold Standard (2018-06-09 13:44:53)

## 2019-03-27 NOTE — Progress Notes (Signed)
Patient: Charles Harrison, Male    DOB: January 13, 1981, 39 y.o.   MRN: [id number] Visit Date: 03/27/2019  Today's Provider: Jairo Ben, FNP   Chief Complaint  Patient presents with  . Establish Care   Subjective:    New Patient Charles Harrison is a 39 y.o. male who presents today for health maintenance and establish care. He feels fairly well, patient would like to address a possible lump/bump that he has found on his left testicle, patient states that it has been present for a week and only had swelling for the first two days of onset. He reports he is not exercising . He reports he is sleeping poorly. Patient states that he has a history of early onset osteoporosis and legg calve perthes, patient would like a referral for PT to [facility] Physical therapy.  ----------------------------------------------------------------- Daughter ( 65 year old ) was positive for Covid 2 days ago. He has no symptoms currently however tells provider he was seen in the ER for sore throat and upper respiratory virus on 03/25/2019.  2 weeks ago beside. left testicle, "lump" looks like pimple. He says it has improved but is still present. Skin is painful.No concerns for STD's. Denies any abnormal discharge. Swelling resolved. testicle was not swollen.  Denies any previous history.   Surgical repair at 39 years old. Leg/ Calf perthes disease. Early osteoporosis. Sees specialist at [facility]. Scoliosis. Has chronic pain.  Right rotator cuff, had injection for scar tissue.  CLINICAL INDICATION: 39 Year Old (M): M25.551 - Pain of both hip joints.    COMPARISON: 06/06/09.  TECHNIQUE: AP views of the pelvis and hips and frog leg lateral views of both hips.  FINDINGS:  Apparent shortening of the right femoral neck and flattened right femoral head are similar to prior. Similar-appearing acetabular subchondral sclerosis. No fracture or dislocation identified. Left hip appears normal.  IMPRESSION: -Unchanged appearance  of both hips; appearance of right hip consistent with remote Legg-Calve-Parthes disease. Other Result Information  Interface, Rad Results In - 05/29/2014  9:47 AM EST EXAM: HIPS BILATERAL WITH ANTEROPOSTERIOR VIEW OF PELVIS DATE: 05/29/14 08:32:47 ACCESSION: [id number] UN DICTATED: 05/29/14 09:24:06 INTERPRETATION LOCATION: [facility]  CLINICAL INDICATION: 39 Year Old (M): M25.551 - Pain of both hip joints.     COMPARISON: 06/06/09.  TECHNIQUE: AP views of the pelvis and hips and frog leg lateral views of both hips.  FINDINGS:  Apparent shortening of the right femoral neck and flattened right femoral head are similar to prior. Similar-appearing acetabular subchondral sclerosis. No fracture or dislocation identified. Left hip appears normal.  IMPRESSION: -Unchanged appearance of both hips; appearance of right hip consistent with remote Legg-Calve-Parthes disease.   Patient  denies any fever, body aches,chills, rash, chest pain, shortness of breath, nausea, vomiting, or diarrhea.    Review of Systems  Constitutional: Negative.   HENT: Negative.   Eyes: Negative.   Respiratory: Negative.   Cardiovascular: Negative.   Gastrointestinal: Negative.   Genitourinary: Positive for genital sores (not open sore was swollen bump- swelling has decreased in past couple of days. ). Negative for decreased urine volume, difficulty urinating, discharge, dysuria, enuresis, flank pain, frequency, hematuria, penile pain, penile swelling, scrotal swelling, testicular pain and urgency.  Musculoskeletal: Positive for arthralgias, back pain, gait problem, joint swelling, myalgias, neck pain and neck stiffness.       Chronic history legg calve perthes, patient would like a referral for PT to [facility] Physical therapy.  Declined any x ray/ dexa or  films at today's visit. Has seen specialist at [facility]. Will continue to follow there.   Skin: Negative.   Neurological: Negative for dizziness, tremors, seizures,  syncope, facial asymmetry, speech difficulty, weakness, light-headedness, numbness and headaches.  Hematological: Negative.   Psychiatric/Behavioral: Positive for decreased concentration. Negative for agitation, behavioral problems, confusion, dysphoric mood, hallucinations, self-injury, sleep disturbance and suicidal ideas. The patient is nervous/anxious. The patient is not hyperactive.   All other systems reviewed and are negative.   Social History He  reports that he has been smoking. He has never used smokeless tobacco. He reports current alcohol use of about 1.0 - 2.0 standard drinks of alcohol per week. He reports current drug use. Drug: Marijuana. Social History   Socioeconomic History  . Marital status: Married    Spouse name: Not on file  . Number of children: Not on file  . Years of education: Not on file  . Highest education level: Not on file  Occupational History  . Not on file  Tobacco Use  . Smoking status: Current Every Day Smoker    Last attempt to quit: 07/15/2017    Years since quitting: 1.6  . Smokeless tobacco: Never Used  Substance and Sexual Activity  . Alcohol use: Yes    Alcohol/week: 1.0 - 2.0 standard drinks    Types: 1 - 2 Cans of beer per week  . Drug use: Yes    Types: Marijuana  . Sexual activity: Not on file  Other Topics Concern  . Not on file  Social History Narrative  . Not on file   Social Determinants of Health   Financial Resource Strain:   . Difficulty of Paying Living Expenses: Not on file  Food Insecurity:   . Worried About Charity fundraiser in the Last Year: Not on file  . Ran Out of Food in the Last Year: Not on file  Transportation Needs:   . Lack of Transportation (Medical): Not on file  . Lack of Transportation (Non-Medical): Not on file  Physical Activity:   . Days of Exercise per Week: Not on file  . Minutes of Exercise per Session: Not on file  Stress:   . Feeling of Stress : Not on file  Social Connections:   .  Frequency of Communication with Friends and Family: Not on file  . Frequency of Social Gatherings with Friends and Family: Not on file  . Attends Religious Services: Not on file  . Active Member of Clubs or Organizations: Not on file  . Attends Archivist Meetings: Not on file  . Marital Status: Not on file    Patient Active Problem List   Diagnosis Date Noted  . PTSD (post-traumatic stress disorder)   . Chronic pain of both hips   . Legg-Calve-Perthes disease, right     Past Surgical History:  Procedure Laterality Date  . HIP ARTHROSCOPY W/ LABRAL REPAIR     BL    Family History  No family status information on file.   His Family history is unknown by patient.     No Known Allergies  Previous Medications   ALBUTEROL (VENTOLIN HFA) 108 (90 BASE) MCG/ACT INHALER    Inhale 2 puffs into the lungs every 6 (six) hours as needed for wheezing or shortness of breath.   CETIRIZINE (ZYRTEC ALLERGY) 10 MG TABLET    Take 1 tablet (10 mg total) by mouth daily.   FLUTICASONE (FLONASE) 50 MCG/ACT NASAL SPRAY    Place 1 spray into  both nostrils daily.   IBUPROFEN (ADVIL,MOTRIN) 800 MG TABLET    Take 1 tablet (800 mg total) by mouth every 8 (eight) hours as needed.    Patient Care Team: Berniece Pap, FNP as PCP - General (Family Medicine)      Objective:   Vitals: Blood pressure (!) 142/78, pulse 87, temperature (!) 97.3 F (36.3 C), temperature source Oral, resp. rate 15, height 5\' 7"  (1.702 m), weight 162 lb (73.5 kg), SpO2 98 %.  Physical Exam Exam conducted with a chaperone present.  Constitutional:      General: He is not in acute distress.    Appearance: Normal appearance. He is not ill-appearing, toxic-appearing or diaphoretic.  HENT:     Head: Normocephalic and atraumatic.     Jaw: There is normal jaw occlusion.     Right Ear: Hearing, ear canal and external ear normal. No swelling or tenderness. A middle ear effusion is present. Tympanic membrane is not  erythematous.     Left Ear: Hearing, ear canal and external ear normal. No drainage, swelling or tenderness. A middle ear effusion is present. Tympanic membrane is not erythematous.     Nose:     Comments: deferred due to Covid 19 mask and his recent exposure and symptoms on 03/24/2018.    Mouth/Throat:     Comments: deferred same as nose exam. Covid office protocol. Eyes:     General: Lids are normal. Lids are everted, no foreign bodies appreciated. Vision grossly intact. Gaze aligned appropriately.     Extraocular Movements: Extraocular movements intact.     Conjunctiva/sclera: Conjunctivae normal.     Pupils: Pupils are equal, round, and reactive to light.     Funduscopic exam:    Right eye: Red reflex present.        Left eye: Red reflex present.    Visual Fields: Right eye visual fields normal and left eye visual fields normal.  Cardiovascular:     Rate and Rhythm: Normal rate and regular rhythm.     Pulses: Normal pulses.          Radial pulses are 2+ on the right side and 2+ on the left side.       Popliteal pulses are 2+ on the right side and 2+ on the left side.     Heart sounds: Normal heart sounds.  Pulmonary:     Effort: Pulmonary effort is normal.     Breath sounds: Normal breath sounds and air entry. No decreased breath sounds, wheezing, rhonchi or rales.  Chest:     Chest wall: No mass, lacerations or tenderness.  Abdominal:     Palpations: Abdomen is soft.     Tenderness: There is no abdominal tenderness. There is no right CVA tenderness or left CVA tenderness.     Hernia: There is no hernia in the left inguinal area or right inguinal area.  Genitourinary:    Penis: Normal. No erythema or discharge.      Testes: Cremasteric reflex is present.        Right: Mass, tenderness, swelling, testicular hydrocele or varicocele not present. Right testis is descended. Cremasteric reflex is present.         Left: Tenderness (small erythematous soft tender  with central pustular  area  mobile with infected hair follicle present. appears localized to skin.  testicle exam normal. otherwise. ) present. Mass, swelling, testicular hydrocele or varicocele not present. Left testis is descended. Cremasteric reflex is present.  Epididymis:     Right: Normal.     Left: Normal.       Comments: Bretlyn Ward CMA chaperon present for exam.   Skin:    Findings: Erythema (see GU for note ) present.     Comments: Denies any other skin lesions/ changes or concerns.   Neurological:     Mental Status: He is alert.  Psychiatric:        Attention and Perception: Attention normal.        Mood and Affect: Mood and affect normal.        Speech: Speech normal.        Behavior: Behavior normal. Behavior is cooperative.        Thought Content: Thought content normal.        Cognition and Memory: Cognition normal.        Judgment: Judgment normal.      Depression Screen PHQ 2/9 Scores 03/27/2019 03/27/2019 06/10/2018  PHQ - 2 Score 2 1 0  PHQ- 9 Score - 9 -      Assessment & Plan:     Routine Health Maintenance and Physical Exam  Exercise Activities and Dietary recommendations Goals   None      There is no immunization history on file for this patient.  Health Maintenance  Topic Date Due  . INFLUENZA VACCINE  10/22/2018  . HIV Screening  06/10/2019 (Originally 11/16/1994)  . TETANUS/TDAP  03/23/2022     Discussed health benefits of physical activity, and encouraged him to engage in regular exercise appropriate for his age and condition.    1. Acute folliculitis Meds ordered this encounter  Medications  . doxycycline (VIBRA-TABS) 100 MG tablet    Sig: Take 1 tablet (100 mg total) by mouth 2 (two) times daily.    Dispense:  20 tablet    Refill:  0   Skin of testicle as noted above. Will treat for suspected folliculitis and will return in 3 weeks for recheck and sooner if needed. After visit summary reviewed.   2. Legg-Calve-Perthes disease, right Keep  follow up with specialist at [facility].  - Ambulatory referral to Physical Therapy  3. Elevated Blood pressure  Monitor at home. Keep log. Bring at return visit.   Return in about 3 weeks (around 04/17/2019), or if symptoms worsen or fail to improve, for at any time for any worsening symptoms, Go to Emergency room/ urgent care if worse. Recheck of skin/ groin/ testicle.   An After Visit Summary was printed and given to the patient.  The entirety of the information documented in the History of Present Illness, Review of Systems and Physical Exam were personally obtained by me. Portions of this information were initially documented by the  Certified Medical Assistant whose name is documented in Epic and reviewed by me for thoroughness and accuracy.  I have personally performed the exam and reviewed the chart and it is accurate to the best of my knowledge.  Museum/gallery conservator has been used and any errors in dictation or transcription are unintentional.  Eula Fried. Flinchum FNP-C  [facility]  --------------------------------------------------------------------

## 2019-04-03 DIAGNOSIS — M25551 Pain in right hip: Secondary | ICD-10-CM | POA: Diagnosis not present

## 2019-04-03 DIAGNOSIS — M25552 Pain in left hip: Secondary | ICD-10-CM | POA: Diagnosis not present

## 2019-04-17 DIAGNOSIS — M25552 Pain in left hip: Secondary | ICD-10-CM | POA: Diagnosis not present

## 2019-04-17 DIAGNOSIS — M25551 Pain in right hip: Secondary | ICD-10-CM | POA: Diagnosis not present

## 2019-04-18 ENCOUNTER — Ambulatory Visit (INDEPENDENT_AMBULATORY_CARE_PROVIDER_SITE_OTHER): Payer: Medicare Other | Admitting: Adult Health

## 2019-04-18 ENCOUNTER — Other Ambulatory Visit: Payer: Self-pay

## 2019-04-18 ENCOUNTER — Encounter: Payer: Self-pay | Admitting: Adult Health

## 2019-04-18 VITALS — BP 120/84 | HR 86 | Temp 96.6°F | Resp 16 | Wt 161.0 lb

## 2019-04-18 DIAGNOSIS — N5089 Other specified disorders of the male genital organs: Secondary | ICD-10-CM | POA: Diagnosis not present

## 2019-04-18 DIAGNOSIS — M25511 Pain in right shoulder: Secondary | ICD-10-CM | POA: Diagnosis not present

## 2019-04-18 DIAGNOSIS — J302 Other seasonal allergic rhinitis: Secondary | ICD-10-CM | POA: Diagnosis not present

## 2019-04-18 DIAGNOSIS — M9111 Juvenile osteochondrosis of head of femur [Legg-Calve-Perthes], right leg: Secondary | ICD-10-CM

## 2019-04-18 DIAGNOSIS — G8929 Other chronic pain: Secondary | ICD-10-CM | POA: Diagnosis not present

## 2019-04-18 NOTE — Progress Notes (Signed)
Patient: Charles Harrison Male    DOB: 10/07/1980   39 y.o.   MRN: [id number] Visit Date: 04/18/2019  Today's Provider: Jairo Ben, FNP   No chief complaint on file.  Subjective:     HPI    Follow up for acute folliculitis  The patient was last seen for this 2 weeks ago. Changes made at last visit include started patient on Doxycycline 100mg .  He reports excellent compliance with treatment. He feels that condition is Unchanged. He is not having side effects.    He is concerned today with area on his left testicle hair follicle has actually improved with treatment. He denies any pain or urinary symptoms. He is concerned with area that has hard skin under this area that was treated for folliculitis. He still denies and declines any STD screening at this time. Denies any exposures he reports.   He says he has been doing his physical therapy with [facility] and feels as if this is helping his hip pain.   Right shoulder pain since injury at age 26. He was told he had a  tear in his rotator cuff in 2004 or 2005 he reports.   He reports since starting the Zyrtec and Flonase nasal spray as directed his congestion and sinus symptoms are improved.   Patient  denies any fever, body aches,chills, rash, chest pain, shortness of breath, nausea, vomiting, or diarrhea.    ------------------------------------------------------------------------------------   No Known Allergies   Current Outpatient Medications:  .  albuterol (VENTOLIN HFA) 108 (90 Base) MCG/ACT inhaler, Inhale 2 puffs into the lungs every 6 (six) hours as needed for wheezing or shortness of breath. (Patient not taking: Reported on 03/27/2019), Disp: 6.7 g, Rfl: 1 .  cetirizine (ZYRTEC ALLERGY) 10 MG tablet, Take 1 tablet (10 mg total) by mouth daily. (Patient not taking: Reported on 03/27/2019), Disp: 30 tablet, Rfl: 2 .   .  fluticasone (FLONASE) 50 MCG/ACT nasal spray, Place 1 spray into both nostrils daily.  (Patient not taking: Reported on 03/27/2019), Disp: 16 g, Rfl: 2 .  ibuprofen (ADVIL,MOTRIN) 800 MG tablet, Take 1 tablet (800 mg total) by mouth every 8 (eight) hours as needed., Disp: 30 tablet, Rfl: 0  Review of Systems  Constitutional: Negative.   HENT: Negative.   Eyes: Negative.   Respiratory: Negative.   Cardiovascular: Negative.   Gastrointestinal: Negative.   Genitourinary: Positive for testicular pain (skin of scrotal sac left  ). Negative for decreased urine volume, difficulty urinating, discharge, dysuria, enuresis, flank pain, frequency, genital sores, hematuria, penile pain, penile swelling, scrotal swelling and urgency.  Musculoskeletal: Positive for arthralgias, back pain, gait problem, joint swelling, myalgias, neck pain and neck stiffness.       Chronic history legg calve perthes, patient would like a referral for PT to [facility] Physical therapy.  Declined any x ray/ dexa or films at today's visit.  Now has right shoulder pain at this visit reports has been present since 2004 or 5 and has been told he tore rotator cuff in past. He denies any surgery for this shoulder. He does report having a steroid injection in the past.   Skin: Negative.   Neurological: Negative for dizziness, tremors, seizures, syncope, facial asymmetry, speech difficulty, weakness, light-headedness, numbness and headaches.  Hematological: Negative.   Psychiatric/Behavioral: Negative for agitation, behavioral problems, confusion, decreased concentration, dysphoric mood, hallucinations, self-injury, sleep disturbance and suicidal ideas. The patient is not nervous/anxious and is not hyperactive.   All  other systems reviewed and are negative.   Social History   Tobacco Use  . Smoking status: Current Every Day Smoker    Last attempt to quit: 07/15/2017    Years since quitting: 1.7  . Smokeless tobacco: Never Used  Substance Use Topics  . Alcohol use: Yes    Alcohol/week: 1.0 - 2.0 standard drinks     Types: 1 - 2 Cans of beer per week      Objective:   Blood pressure 120/84, pulse 86, temperature (!) 96.6 F (35.9 C), temperature source temporal, resp. rate 16, weight 161 lb (73 kg), SpO2 98 %.   Physical Exam Vitals reviewed. Exam conducted with a chaperone present.  Constitutional:      General: He is not in acute distress.    Appearance: Normal appearance. He is not ill-appearing, toxic-appearing or diaphoretic.  HENT:     Head: Normocephalic and atraumatic.     Jaw: There is normal jaw occlusion.     Right Ear: Hearing, ear canal and external ear normal. No swelling or tenderness. A middle ear effusion is present. Tympanic membrane is not erythematous.     Left Ear: Hearing, ear canal and external ear normal. No drainage, swelling or tenderness. A middle ear effusion is present. Tympanic membrane is not erythematous.     Nose:     Comments: deferred due to Covid 19 mask and his recent exposure and symptoms on 03/24/2018.    Mouth/Throat:     Comments: deferred same as nose exam. Covid office protocol. Eyes:     General: Lids are normal. Lids are everted, no foreign bodies appreciated. Vision grossly intact. Gaze aligned appropriately.     Extraocular Movements: Extraocular movements intact.     Conjunctiva/sclera: Conjunctivae normal.     Pupils: Pupils are equal, round, and reactive to light.     Funduscopic exam:    Right eye: Red reflex present.        Left eye: Red reflex present.    Visual Fields: Right eye visual fields normal and left eye visual fields normal.  Cardiovascular:     Rate and Rhythm: Normal rate and regular rhythm.     Pulses: Normal pulses.          Radial pulses are 2+ on the right side and 2+ on the left side.       Popliteal pulses are 2+ on the right side and 2+ on the left side.     Heart sounds: Normal heart sounds.  Pulmonary:     Effort: Pulmonary effort is normal.     Breath sounds: Normal breath sounds and air entry. No decreased breath  sounds, wheezing, rhonchi or rales.  Chest:     Chest wall: No mass, lacerations or tenderness.  Abdominal:     Palpations: Abdomen is soft.     Tenderness: There is no abdominal tenderness. There is no right CVA tenderness or left CVA tenderness.     Hernia: There is no hernia in the left inguinal area or right inguinal area.  Genitourinary:    Penis: Normal. No erythema or discharge.      Testes: Cremasteric reflex is present.        Right: Mass, tenderness, swelling, testicular hydrocele or varicocele not present. Right testis is descended. Cremasteric reflex is present.         Left: Tenderness (Hair follicle has resolved, no erythema or pustule now. Skin of left testicle with small firm nodule likely relate to recent  follicle infection.) present. Mass, swelling, testicular hydrocele or varicocele not present. Left testis is descended. Cremasteric reflex is present.      Epididymis:     Right: Normal.     Left: Normal.       Comments:   Skin:    Findings: No erythema (resolved since last visit ).     Comments: Denies any other skin lesions/ changes or concerns.   Neurological:     Mental Status: He is alert.  Psychiatric:        Attention and Perception: Attention normal.        Mood and Affect: Mood and affect normal.        Speech: Speech normal.        Behavior: Behavior normal. Behavior is cooperative.        Thought Content: Thought content normal.        Cognition and Memory: Cognition normal.        Judgment: Judgment normal.      No results found for any visits on 04/18/19.     Assessment & Plan     .Chronic right shoulder pain - Plan: Ambulatory referral to Orthopedic Surgery  Legg-Calve-Perthes disease, right - Plan: Ambulatory referral to Orthopedic Surgery  Seasonal allergies  Testicular lump - Plan: US SCROTUM  folliculitis from previous visit has resolved. Testicular skin lump left - will obtain ultrasound. Differentials include resolving hair  follicle infection, spermatocele or other skin growth. Red flags discussed and when to seek care immediately.   Orders Placed This Encounter  Procedures  . US SCROTUM  . Ambulatory referral to Orthopedic Surgery   Return in about 1 month (around 05/19/2019), or if symptoms worsen or fail to improve, for at any time for any worsening symptoms, Go to Emergency room/ urgent care if worse. Advised patient call the office or your primary care doctor for an appointment if no improvement within 72 hours or if any symptoms change or worsen at any time  Advised ER or urgent Care if after hours or on weekend. Call 911 for emergency symptoms at any time.Patinet verbalized understanding of all instructions given/reviewed and treatment plan and has no further questions or concerns at this time.      The entirety of the information documented in the History of Present Illness, Review of Systems and Physical Exam were personally obtained by me. Portions of this information were initially documented by the  Certified Medical Assistant whose name is documented in [facility] and reviewed by me for thoroughness and accuracy.  I have personally performed the exam and reviewed the chart and it is accurate to the best of my knowledge.  Haematologist has been used and any errors in dictation or transcription are unintentional.  Kelby Aline. [place], [facility]

## 2019-04-18 NOTE — Addendum Note (Signed)
Addended by: Berniece Pap on: 04/18/2019 04:49 PM   Modules accepted: Orders

## 2019-04-18 NOTE — Patient Instructions (Signed)
Testicular Self-Exam A self-exam of your testicles (testicular self-exam) is looking at and feeling your testicles for unusual lumps or swelling. Swelling, lumps, or pain can be caused by:  Injuries.  Puffiness, redness, and soreness (inflammation).  Infection.  Extra fluids around your testicle (hydrocele).  Twisted testicles (testicular torsion).  Cancer of the testicle (testicular cancer). Why is it important to do a self-exam of testicles? You may need to do self-exams if you are at risk for cancer of the testicles. You may be at risk if you have:  A testicle that has not descended (cryptorchidism).  A history of cancer of the testicle.  A family history of cancer of the testicle. How to do a self-exam of testicles It is easiest to do a self-exam after a warm bath or shower. Testicles are harder to examine when you are cold. A normal testicle is egg-shaped and feels firm. It is smooth, and it is not tender. At the back of your testicles, there is a firm cord that feels like spaghetti (spermatic cord). Look and feel for changes  Stand and hold your penis away from your body.  Look at each testicle to check for lumps or swelling.  Roll each testicle between your thumb and finger. Feel the whole testicle. Feel for: ? Lumps. ? Swelling. ? Discomfort.  Check for swelling or tender bumps in the groin area. Your groin is where your lower belly (abdomen) meets your upper thighs. Contact a health care provider if:  You find a bump or lump. This may be like a small, hard bump that is the size of a pea.  You find swelling.  You find pain.  You find soreness.  You see or feel any other changes. Summary  A self-exam of your testicles is looking at and feeling your testicles for lumps or swelling.  You may need to do self-exams if you are at risk for cancer of the testicle.  You should check each of your testicles for lumps, swelling, or discomfort.  You should check for  swelling or tender bumps in the groin area. Your groin is where your lower belly (abdomen) meets your upper thighs. This information is not intended to replace advice given to you by your health care provider. Make sure you discuss any questions you have with your health care provider. Document Revised: 06/30/2018 Document Reviewed: 02/03/2016 Elsevier Patient Education  [place]. Shoulder Pain Many things can cause shoulder pain, including:  An injury.  Moving the shoulder in the same way again and again (overuse).  Joint pain (arthritis). Pain can come from:  Swelling and irritation (inflammation) of any part of the shoulder.  An injury to the shoulder joint.  An injury to: ? Tissues that connect muscle to bone (tendons). ? Tissues that connect bones to each other (ligaments). ? Bones. Follow these instructions at home: Watch for changes in your symptoms. Let your doctor know about them. Follow these instructions to help with your pain. If you have a sling:  Wear the sling as told by your doctor. Remove it only as told by your doctor.  Loosen the sling if your fingers: ? Tingle. ? Become numb. ? Turn cold and blue.  Keep the sling clean.  If the sling is not waterproof: ? Do not let it get wet. ? Take the sling off when you shower or bathe. Managing pain, stiffness, and swelling   If told, put ice on the painful area: ? Put ice in a plastic  bag. ? Place a towel between your skin and the bag. ? Leave the ice on for 20 minutes, 2-3 times a day. Stop putting ice on if it does not help with the pain.  Squeeze a soft ball or a foam pad as much as possible. This prevents swelling in the shoulder. It also helps to strengthen the arm. General instructions  Take over-the-counter and prescription medicines only as told by your doctor.  Keep all follow-up visits as told by your doctor. This is important. Contact a doctor if:  Your pain gets worse.  Medicine  does not help your pain.  You have new pain in your arm, hand, or fingers. Get help right away if:  Your arm, hand, or fingers: ? Tingle. ? Are numb. ? Are swollen. ? Are painful. ? Turn white or blue. Summary  Shoulder pain can be caused by many things. These include injury, moving the shoulder in the same away again and again, and joint pain.  Watch for changes in your symptoms. Let your doctor know about them.  This condition may be treated with a sling, ice, and pain medicine.  Contact your doctor if the pain gets worse or you have new pain. Get help right away if your arm, hand, or fingers tingle or get numb, swollen, or painful.  Keep all follow-up visits as told by your doctor. This is important. This information is not intended to replace advice given to you by your health care provider. Make sure you discuss any questions you have with your health care provider. Document Revised: 09/21/2017 Document Reviewed: 09/21/2017 Elsevier Patient Education  2020 [employer].

## 2019-04-19 ENCOUNTER — Telehealth: Payer: Self-pay

## 2019-04-19 DIAGNOSIS — M25511 Pain in right shoulder: Secondary | ICD-10-CM

## 2019-04-19 DIAGNOSIS — G8929 Other chronic pain: Secondary | ICD-10-CM

## 2019-04-19 NOTE — Telephone Encounter (Signed)
Copied from CRM [id number]. Topic: Referral - Request for Referral >> Apr 19, 2019  2:43 PM Angela Nevin wrote: Has patient seen PCP for this complaint? Yes *If NO, is insurance requiring patient see PCP for this issue before PCP can refer them? Referral for which specialty: PT  Preferred provider/office: [facility] on [address]  Reason for referral:  rt shoulder

## 2019-04-19 NOTE — Addendum Note (Signed)
Addended by: Fonda Kinder on: 04/19/2019 03:00 PM   Modules accepted: Orders

## 2019-04-19 NOTE — Telephone Encounter (Signed)
Copied from CRM [id number]. Topic: General - Other >> Apr 18, 2019  4:45 PM Maye Hides wrote: Reason for CRM: Please change order to ultrasound scrotum with doppler,Thanks

## 2019-04-19 NOTE — Telephone Encounter (Signed)
Was changed  yesterday 04/18/2019 when Maye Hides messaged me.

## 2019-04-19 NOTE — Telephone Encounter (Signed)
At todays follow up patient mentioned that he would need a separate referral placed in for his shoulder, order has been out in chart so patient can be evaluated. KW

## 2019-04-19 NOTE — Telephone Encounter (Signed)
See message below about order. KW

## 2019-04-20 ENCOUNTER — Other Ambulatory Visit: Payer: Self-pay

## 2019-04-20 ENCOUNTER — Other Ambulatory Visit: Payer: Self-pay | Admitting: Adult Health

## 2019-04-20 ENCOUNTER — Telehealth: Payer: Self-pay

## 2019-04-20 ENCOUNTER — Ambulatory Visit
Admission: RE | Admit: 2019-04-20 | Discharge: 2019-04-20 | Disposition: A | Discharge status: Home / Self Care | Payer: Medicare Other | Source: Ambulatory Visit | Attending: Adult Health | Admitting: Adult Health

## 2019-04-20 DIAGNOSIS — N5089 Other specified disorders of the male genital organs: Secondary | ICD-10-CM

## 2019-04-20 DIAGNOSIS — N433 Hydrocele, unspecified: Secondary | ICD-10-CM | POA: Diagnosis not present

## 2019-04-20 MED ORDER — IBUPROFEN 800 MG PO TABS: 800.0000 mg | ORAL_TABLET | Freq: Three times a day (TID) | ORAL | 0 refills | Status: DC | PRN

## 2019-04-20 MED ORDER — IBUPROFEN 800 MG PO TABS
800.0000 mg | ORAL_TABLET | Freq: Three times a day (TID) | ORAL | 0 refills | Status: AC | PRN
Start: 2019-04-20 — End: ?

## 2019-04-20 MED ORDER — AMOXICILLIN 875 MG PO TABS: 875.0000 mg | ORAL_TABLET | Freq: Two times a day (BID) | ORAL | 0 refills | Status: DC

## 2019-04-20 NOTE — Telephone Encounter (Signed)
Patient was advised and states that he has no allergies and appreciated [place] calling him in Amoxicillin. I gave patient information about [facility] and [facility] as well to look into for assistance, he states that he will not need a prescription for Ibuprofen. KW

## 2019-04-20 NOTE — Telephone Encounter (Signed)
Done

## 2019-04-20 NOTE — Telephone Encounter (Signed)
Copied from CRM [id number]. Topic: General - Inquiry >> Apr 20, 2019 10:19 AM Deborha Payment wrote: Reason for CRM: Patient is having swollen gums on right side.  Patient states it is pretty painful.  Patient would like a nurse to call back asap to see what the next step is.  If PCP wants him to come into the office or if she could call him in some medication. Call back [phone]

## 2019-04-20 NOTE — Telephone Encounter (Signed)
He did not mention this to me in the office. I can send prescription Ibuprofen to take as directed and as needed with the antibiotic Amoxicillin for him if he denies any allergies and he should call a dental clinic, or dentist as soon as possible for dental evaluation. This should not wait as dental infections/ abscess can lead to more serious infections and even death.   If he has any severe pain, or fever, chills, nausea, vomiting or other symptoms he needs the emergency room so that he can be evaluated by specialist. I am happy to see him in the office as well if he would like to return.

## 2019-04-20 NOTE — Telephone Encounter (Signed)
Spoke with patient on the phone who reports that when he was in office on 04/18/19 he was experiencing some pain in his mouth but did not address at visit. Patient states that he has had swelling around his jaw line and gum on the right side. Patient states that he has a past history of dental problems and is not established with a dentist nor has dental insurance. Patient states that area around his gum is white an swollen. Patient reports that he has tried otc Tyenol and warm water salt rinse with no relief and is wanting to know if we can call him in a prescription to help with pain/discomfort? Please advise. KW

## 2019-04-20 NOTE — Progress Notes (Signed)
Testicular exam was normal, no torsion or mass seen.Very small hydroceles ( tiny fluid filled sacs) seen on both testicles, these are of no concern unless he has pain or changes.   The area on the skin of the left testicle he was concerned with , shows likely a sebaceous or epidermoid cyst, I suspect the area where the hair follicle was ingrown and infected at initial visit has hardened. He can do warm compresses three to 4 times per day if able and if area does not resolve he can see dermatology.

## 2019-04-20 NOTE — Progress Notes (Signed)
Meds ordered this encounter  Medications  . amoxicillin (AMOXIL) 875 MG tablet    Sig: Take 1 tablet (875 mg total) by mouth 2 (two) times daily. See dentist as soon as possible    Dispense:  20 tablet    Refill:  0  . ibuprofen (ADVIL) 800 MG tablet    Sig: Take 1 tablet (800 mg total) by mouth every 8 (eight) hours as needed.    Dispense:  30 tablet    Refill:  0  he will get in with dentist as soon as possible. Come to office if any symptoms worsening and seek after hours care or emergency room if any systemic such as fever chills, nausea , vomiting  or worsening symptoms immediately.

## 2019-05-01 DIAGNOSIS — M25552 Pain in left hip: Secondary | ICD-10-CM | POA: Diagnosis not present

## 2019-05-01 DIAGNOSIS — M25551 Pain in right hip: Secondary | ICD-10-CM | POA: Diagnosis not present

## 2019-05-15 ENCOUNTER — Telehealth: Payer: Self-pay

## 2019-05-15 ENCOUNTER — Other Ambulatory Visit: Payer: Self-pay | Admitting: Adult Health

## 2019-05-15 DIAGNOSIS — M25552 Pain in left hip: Secondary | ICD-10-CM | POA: Diagnosis not present

## 2019-05-15 DIAGNOSIS — M25551 Pain in right hip: Secondary | ICD-10-CM | POA: Diagnosis not present

## 2019-05-15 NOTE — Telephone Encounter (Signed)
Please advise if patient will need virtual visit or okay to fill prescription? KW

## 2019-05-15 NOTE — Telephone Encounter (Signed)
Copied from CRM [id number]. Topic: General - Other >> May 15, 2019 10:53 AM Daphine Deutscher D wrote: Reason for CRM: Pt called to see if he can get another round of antibiotic.  He finished the first round on friday but thinks he may have mixed a couple of doses/  He wants to know if he get the liquid.  He has difficulty swallowing pills.  [facility]  CB#  [phone]

## 2019-05-15 NOTE — Telephone Encounter (Signed)
Do not recommend refill on antibiotics. If persistent or worsening symptoms needs evaluation in office.

## 2019-05-15 NOTE — Telephone Encounter (Signed)
Patient has been advised and scheduled appt for this Thursday to be evaluated. KW

## 2019-05-17 DIAGNOSIS — M25511 Pain in right shoulder: Secondary | ICD-10-CM | POA: Diagnosis not present

## 2019-05-18 ENCOUNTER — Other Ambulatory Visit: Payer: Self-pay

## 2019-05-18 ENCOUNTER — Encounter: Payer: Self-pay | Admitting: Adult Health

## 2019-05-18 ENCOUNTER — Ambulatory Visit (INDEPENDENT_AMBULATORY_CARE_PROVIDER_SITE_OTHER): Payer: Medicare Other | Admitting: Adult Health

## 2019-05-18 VITALS — BP 110/70 | HR 95 | Temp 97.5°F | Resp 16 | Wt 156.0 lb

## 2019-05-18 DIAGNOSIS — B356 Tinea cruris: Secondary | ICD-10-CM | POA: Diagnosis not present

## 2019-05-18 DIAGNOSIS — K047 Periapical abscess without sinus: Secondary | ICD-10-CM

## 2019-05-18 MED ORDER — AMOXICILLIN-POT CLAVULANATE 875-125 MG PO TABS: 1.0000 | ORAL_TABLET | Freq: Two times a day (BID) | ORAL | 0 refills | Status: DC

## 2019-05-18 MED ORDER — FLUCONAZOLE 150 MG PO TABS: 150.0000 mg | ORAL_TABLET | ORAL | 0 refills | Status: AC

## 2019-05-18 NOTE — Patient Instructions (Signed)
Skin Yeast Infection  A skin yeast infection is a condition in which there is an overgrowth of yeast (candida) that normally lives on the skin. This condition usually occurs in areas of the skin that are constantly warm and moist, such as the armpits or the groin. What are the causes? This condition is caused by a change in the normal balance of the yeast and bacteria that live on the skin. What increases the risk? You are more likely to develop this condition if you:  Are obese.  Are pregnant.  Take birth control pills.  Have diabetes.  Take antibiotic medicines.  Take steroid medicines.  Are malnourished.  Have a weak body defense system (immune system).  Are 40 years of age or older.  Wear tight clothing. What are the signs or symptoms? The most common symptom of this condition is itchiness in the affected area. Other symptoms include:  Red, swollen area of the skin.  Bumps on the skin. How is this diagnosed?  This condition is diagnosed with a medical history and physical exam.  Your health care provider may check for yeast by taking light scrapings of the skin to be viewed under a microscope. How is this treated? This condition is treated with medicine. Medicines may be prescribed or be available over the counter. The medicines may be:  Taken by mouth (orally).  Applied as a cream or powder to your skin. Follow these instructions at home:   Take or apply over-the-counter and prescription medicines only as told by your health care provider.  Maintain a healthy weight. If you need help losing weight, talk with your health care provider.  Keep your skin clean and dry.  If you have diabetes, keep your blood sugar under control.  Keep all follow-up visits as told by your health care provider. This is important. Contact a health care provider if:  Your symptoms go away and then return.  Your symptoms do not get better with treatment.  Your symptoms get  worse.  Your rash spreads.  You have a fever or chills.  You have new symptoms.  You have new warmth or redness of your skin. Summary  A skin yeast infection is a condition in which there is an overgrowth of yeast (candida) that normally lives on the skin. This condition is caused by a change in the normal balance of the yeast and bacteria that live on the skin.  Take or apply over-the-counter and prescription medicines only as told by your health care provider.  Keep your skin clean and dry.  Contact a health care provider if your symptoms do not get better with treatment. This information is not intended to replace advice given to you by your health care provider. Make sure you discuss any questions you have with your health care provider. Document Revised: 07/27/2017 Document Reviewed: 07/27/2017 Elsevier Patient Education  2020 [employer] Dental Abscess  A dental abscess is an area of pus in or around a tooth. It comes from an infection. It can cause pain and other symptoms. Treatment will help with symptoms and prevent the infection from spreading. Follow these instructions at home: Medicines  Take over-the-counter and prescription medicines only as told by your dentist.  If you were prescribed an antibiotic medicine, take it as told by your dentist. Do not stop taking it even if you start to feel better.  If you were prescribed a gel that has numbing medicine in it, use it exactly as told.  Do  not drive or use heavy machinery (like a Conservation officer, nature) while taking prescription pain medicine. General instructions  Rinse out your mouth often with salt water. ? To make salt water, dissolve -1 tsp of salt in 1 cup of warm water.  Eat a soft diet while your mouth is healing.  Drink enough fluid to keep your urine pale yellow.  Do not apply heat to the outside of your mouth.  Do not use any products that contain nicotine or tobacco. These include cigarettes and  e-cigarettes. If you need help quitting, ask your doctor.  Keep all follow-up visits as told by your dentist. This is important. Prevent an abscess  Brush your teeth every morning and every night. Use fluoride toothpaste.  Floss your teeth each day.  Get dental cleanings as often as told by your dentist.  Think about getting dental sealant put on teeth that have deep holes (decay).  Drink water that has fluoride in it. ? Most tap water has fluoride. ? Check the label on bottled water to see if it has fluoride in it.  Drink water instead of sugary drinks.  Eat healthy meals and snacks.  Wear a mouth guard or face shield when you play sports. Contact a doctor if:  Your pain is worse, and medicine does not help. Get help right away if:  You have a fever or chills.  Your symptoms suddenly get worse.  You have a very bad headache.  You have problems breathing or swallowing.  You have trouble opening your mouth.  You have swelling in your neck or close to your eye. Summary  A dental abscess is an area of pus in or around a tooth. It is caused by an infection.  Treatment will help with symptoms and prevent the infection from spreading.  Take over-the-counter and prescription medicines only as told by your dentist.  To prevent an abscess, take good care of your teeth. Brush your teeth every morning and night. Use floss every day.  Get dental cleanings as often as told by your dentist. This information is not intended to replace advice given to you by your health care provider. Make sure you discuss any questions you have with your health care provider. Document Revised: 06/29/2018 Document Reviewed: 11/09/2016 Elsevier Patient Education  2020 [employer].

## 2019-05-18 NOTE — Progress Notes (Signed)
Patient: Charles Harrison Male    DOB: Nov 30, 1980   39 y.o.   MRN: [id number] Visit Date: 05/18/2019  Today's Provider: Marcille Buffy, FNP   Chief Complaint  Patient presents with  . Oral Pain   Subjective:     Oral Pain  This is a new problem. The current episode started more than 1 month ago. The problem has been gradually improving. Pertinent negatives include no fever or sinus pressure. Associated symptoms comments: Swelling of the gums and pain on the right side of jaw. Treatments tried: course of Amoxicillin on 04/20/2019; also has tried warm salt water gargles. The treatment provided mild relief.   Broken off tooth in back. Pain with sensitivity.   He took Amoxicillin for the dental pain and it helped the area and has not followed up with a dentist.   He is going to    Jock itch cream- using over the counter cream x 1 month not improving. Very itchy.   Patient  denies any fever, body aches,chills,chest pain, shortness of breath, nausea, vomiting, or diarrhea.    No Known Allergies   Current Outpatient Medications:  .  cetirizine (ZYRTEC ALLERGY) 10 MG tablet, Take 1 tablet (10 mg total) by mouth daily., Disp: 30 tablet, Rfl: 2 .  fluticasone (FLONASE) 50 MCG/ACT nasal spray, Place 1 spray into both nostrils daily., Disp: 16 g, Rfl: 2 .  albuterol (VENTOLIN HFA) 108 (90 Base) MCG/ACT inhaler, Inhale 2 puffs into the lungs every 6 (six) hours as needed for wheezing or shortness of breath. (Patient not taking: Reported on 03/27/2019), Disp: 6.7 g, Rfl: 1 .  ibuprofen (ADVIL) 800 MG tablet, Take 1 tablet (800 mg total) by mouth every 8 (eight) hours as needed. (Patient not taking: Reported on 05/18/2019), Disp: 30 tablet, Rfl: 0 .  meloxicam (MOBIC) 15 MG tablet, Take 1 tablet by mouth daily., Disp: , Rfl:   Review of Systems  Constitutional: Negative for activity change, appetite change, chills, diaphoresis, fatigue, fever and unexpected weight change.  HENT:  Positive for dental problem and sore throat. Negative for congestion, drooling, ear discharge, ear pain, facial swelling, hearing loss, mouth sores, nosebleeds, postnasal drip, rhinorrhea, sinus pressure, sinus pain, sneezing, tinnitus and trouble swallowing.        Right jaw pain  Respiratory: Negative.  Negative for apnea, cough, choking, chest tightness, shortness of breath, wheezing and stridor.   Cardiovascular: Negative.  Negative for chest pain, palpitations and leg swelling.  Gastrointestinal: Negative.  Negative for abdominal pain, nausea and vomiting.  Genitourinary: Negative.   Musculoskeletal: Negative.   Skin: Negative.   Neurological: Negative.   Hematological: Negative.   Psychiatric/Behavioral: Negative.     Social History   Tobacco Use  . Smoking status: Current Every Day Smoker    Packs/day: 1.00    Last attempt to quit: 07/15/2017    Years since quitting: 1.8  . Smokeless tobacco: Never Used  Substance Use Topics  . Alcohol use: Yes    Alcohol/week: 1.0 - 2.0 standard drinks    Types: 1 - 2 Cans of beer per week      Objective:   BP 110/70 (BP Location: Right Arm, Patient Position: Sitting, Cuff Size: Normal)   Pulse 95   Temp (!) 97.5 F (36.4 C) (Temporal)   Resp 16   Wt 156 lb (70.8 kg)   SpO2 96% Comment: room air  BMI 24.43 kg/m  Vitals:   05/18/19 1456  BP: 110/70  Pulse: 95  Resp: 16  Temp: (!) 97.5 F (36.4 C)  TempSrc: Temporal  SpO2: 96%  Weight: 156 lb (70.8 kg)  Body mass index is 24.43 kg/m.   Physical Exam Vitals reviewed.  Constitutional:      General: He is not in acute distress.    Appearance: Normal appearance. He is not ill-appearing, toxic-appearing or diaphoretic.     Comments: Patient moves on and off of exam table and in room without difficulty. Gait is normal in hall and in room. Patient is oriented to person place time and situation. Patient answers questions appropriately and engages in conversation.   HENT:      Head: Normocephalic.     Jaw: There is normal jaw occlusion.     Salivary Glands: Right salivary gland is not diffusely enlarged or tender. Left salivary gland is not diffusely enlarged or tender.     Right Ear: Hearing, tympanic membrane, ear canal and external ear normal. There is no impacted cerumen.     Left Ear: Hearing, tympanic membrane, ear canal and external ear normal. There is no impacted cerumen.     Nose: Nose normal. No congestion or rhinorrhea.     Right Sinus: No maxillary sinus tenderness or frontal sinus tenderness.     Left Sinus: No maxillary sinus tenderness or frontal sinus tenderness.     Mouth/Throat:     Lips: Pink.     Mouth: Mucous membranes are moist.     Dentition: Abnormal dentition. Dental tenderness, gingival swelling, dental caries and dental abscesses present. No gum lesions.     Pharynx: Oropharynx is clear. Uvula midline. No oropharyngeal exudate, posterior oropharyngeal erythema or uvula swelling.     Tonsils: No tonsillar exudate or tonsillar abscesses. 0 on the right. 0 on the left.      Comments: Molar marked has been broken off, gum surrounding tooth is inflamed and reddened.  Eyes:     General: No scleral icterus.       Right eye: No discharge.        Left eye: No discharge.     Extraocular Movements: Extraocular movements intact.     Conjunctiva/sclera: Conjunctivae normal.     Pupils: Pupils are equal, round, and reactive to light.  Neck:     Vascular: No carotid bruit.  Cardiovascular:     Rate and Rhythm: Normal rate and regular rhythm.     Pulses: Normal pulses.     Heart sounds: Normal heart sounds. No murmur. No friction rub. No gallop.   Pulmonary:     Effort: Pulmonary effort is normal. No respiratory distress.     Breath sounds: Normal breath sounds. No stridor. No wheezing, rhonchi or rales.  Chest:     Chest wall: No tenderness.  Abdominal:     Palpations: Abdomen is soft.  Musculoskeletal:        General: Normal range of  motion.     Cervical back: Normal range of motion and neck supple. No rigidity or tenderness.  Lymphadenopathy:     Cervical: No cervical adenopathy.  Skin:    General: Skin is warm and dry.     Capillary Refill: Capillary refill takes less than 2 seconds.     Coloration: Skin is not jaundiced or pale.     Findings: Erythema and rash present. No bruising or lesion.     Nails: There is no clubbing.          Comments: Ring shaped erythematous patchy areas of  groin, moist, no obviously drainage. No skin temperature change.   Neurological:     Mental Status: He is alert and oriented to person, place, and time.     Cranial Nerves: No cranial nerve deficit.     Sensory: No sensory deficit.     Motor: No weakness.     Coordination: Coordination normal.     Gait: Gait normal.     Deep Tendon Reflexes: Reflexes normal.  Psychiatric:        Mood and Affect: Mood normal.        Behavior: Behavior normal.        Thought Content: Thought content normal.        Judgment: Judgment normal.      No results found for any visits on 05/18/19.     Assessment & Plan     Tinea cruris - Plan: CBC with Differential/Platelet, Comprehensive Metabolic Panel (CMET)  Dental infection - Plan: CBC with Differential/Platelet, Comprehensive Metabolic Panel (CMET)   Meds ordered this encounter  Medications  . amoxicillin-clavulanate (AUGMENTIN) 875-125 MG tablet    Sig: Take 1 tablet by mouth 2 (two) times daily.    Dispense:  20 tablet    Refill:  0  . fluconazole (DIFLUCAN) 150 MG tablet    Sig: Take 1 tablet (150 mg total) by mouth as directed. Take one tablet by mouth on day 1. May repeat dose of one tablet by mouth on day four.    Dispense:  2 tablet    Refill:  0   He can then go back to using the spray for jock itch to prevent overgrowth and help with sweating after completing Diflucan.  He has a history of antibiotic use recently due to dental infection and now is getting Augmentin for  dental infection. He is advised to get in with dentist as soon as possible and reviewed dangers of untreated dental infection with patient.   Resources for dental clinics was given.   Emergent RED flag symptoms discussed and when to seek care immediately.  Advised patient call the office or your primary care doctor for an appointment if no improvement within 72 hours or if any symptoms change or worsen at any time  Advised ER or urgent Care if after hours or on weekend. Call 911 for emergency symptoms at any time.Patinet verbalized understanding of all instructions given/reviewed and treatment plan and has no further questions or concerns at this time.   Return in about 1 week (around 05/25/2019), or if symptoms worsen or fail to improve, for at any time for any worsening symptoms, Go to Emergency room/ urgent care if worse.   The entirety of the information documented in the History of Present Illness, Review of Systems and Physical Exam were personally obtained by me. Portions of this information were initially documented by the  Certified Medical Assistant whose name is documented in Epic and reviewed by me for thoroughness and accuracy.  I have personally performed the exam and reviewed the chart and it is accurate to the best of my knowledge.  Museum/gallery conservator has been used and any errors in dictation or transcription are unintentional.  Eula Fried. Flinchum FNP-C  [facility]  Jairo Ben, FNP  [facility]

## 2019-05-19 ENCOUNTER — Ambulatory Visit: Payer: Self-pay | Admitting: Adult Health

## 2019-05-19 LAB — COMPREHENSIVE METABOLIC PANEL
ALT: 16 IU/L (ref 0–44)
AST: 15 IU/L (ref 0–40)
Albumin/Globulin Ratio: 1.8 (ref 1.2–2.2)
Albumin: 4.6 g/dL (ref 4.0–5.0)
Alkaline Phosphatase: 76 IU/L (ref 39–117)
BUN/Creatinine Ratio: 10 (ref 9–20)
BUN: 11 mg/dL (ref 6–20)
Bilirubin Total: 0.2 mg/dL (ref 0.0–1.2)
CO2: 25 mmol/L (ref 20–29)
Calcium: 9.3 mg/dL (ref 8.7–10.2)
Chloride: 102 mmol/L (ref 96–106)
Creatinine, Ser: 1.09 mg/dL (ref 0.76–1.27)
GFR calc Af Amer: 98 mL/min/{1.73_m2} (ref >59)
GFR calc non Af Amer: 85 mL/min/{1.73_m2} (ref >59)
Globulin, Total: 2.6 g/dL (ref 1.5–4.5)
Glucose: 109 mg/dL — ABNORMAL HIGH (ref 65–99)
Potassium: 4.3 mmol/L (ref 3.5–5.2)
Sodium: 140 mmol/L (ref 134–144)
Total Protein: 7.2 g/dL (ref 6.0–8.5)

## 2019-05-19 LAB — CBC WITH DIFFERENTIAL/PLATELET
Basophils Absolute: 0.1 10*3/uL (ref 0.0–0.2)
Basos: 1 %
EOS (ABSOLUTE): 0.2 10*3/uL (ref 0.0–0.4)
Eos: 4 %
Hematocrit: 42.9 % (ref 37.5–51.0)
Hemoglobin: 15.1 g/dL (ref 13.0–17.7)
Immature Grans (Abs): 0 10*3/uL (ref 0.0–0.1)
Immature Granulocytes: 0 %
Lymphocytes Absolute: 1.6 10*3/uL (ref 0.7–3.1)
Lymphs: 25 %
MCH: 34.3 pg — ABNORMAL HIGH (ref 26.6–33.0)
MCHC: 35.2 g/dL (ref 31.5–35.7)
MCV: 98 fL — ABNORMAL HIGH (ref 79–97)
Monocytes Absolute: 0.5 10*3/uL (ref 0.1–0.9)
Monocytes: 8 %
Neutrophils Absolute: 3.9 10*3/uL (ref 1.4–7.0)
Neutrophils: 62 %
Platelets: 264 10*3/uL (ref 150–450)
RBC: 4.4 x10E6/uL (ref 4.14–5.80)
RDW: 12.4 % (ref 11.6–15.4)
WBC: 6.2 10*3/uL (ref 3.4–10.8)

## 2019-05-19 NOTE — Progress Notes (Signed)
CBC and CMP ok. Follow up as discussed. See dentist as soon as possible for evaluation.

## 2019-05-29 DIAGNOSIS — M25551 Pain in right hip: Secondary | ICD-10-CM | POA: Diagnosis not present

## 2019-05-29 DIAGNOSIS — M25552 Pain in left hip: Secondary | ICD-10-CM | POA: Diagnosis not present

## 2019-06-06 ENCOUNTER — Other Ambulatory Visit: Payer: Self-pay | Admitting: Adult Health

## 2019-06-06 NOTE — Telephone Encounter (Signed)
Requested medication (s) are due for refill today: yes  Requested medication (s) are on the active medication list: yes  Last refill:  05/18/19  Future visit scheduled: yes  Notes to clinic:  no assigned protocol    Requested Prescriptions  Pending Prescriptions Disp Refills   amoxicillin-clavulanate (AUGMENTIN) 875-125 MG tablet 20 tablet 0    Sig: Take 1 tablet by mouth 2 (two) times daily.      Off-Protocol Failed - 06/06/2019 12:32 PM      Failed - Medication not assigned to a protocol, review manually.      Passed - Valid encounter within last 12 months    Recent Outpatient Visits           2 weeks ago Tinea cruris   [facility] Flinchum, Eula Fried, FNP   1 month ago Chronic right shoulder pain   [facility] Flinchum, Eula Fried, FNP   2 months ago Acute folliculitis   [facility] Flinchum, Eula Fried, FNP

## 2019-06-06 NOTE — Telephone Encounter (Signed)
Pt called and is requesting to have amoxicillin sent in for his mouth as he has an infection on his bottom right jaw. Please advise.      [facility] [address] [place] - [address]  [address] [place] [address]  Phone: [phone] Fax: [phone]  Not a 24 hour pharmacy; exact hours not known.

## 2019-06-07 ENCOUNTER — Other Ambulatory Visit: Payer: Self-pay | Admitting: Adult Health

## 2019-06-07 MED ORDER — AMOXICILLIN-POT CLAVULANATE 875-125 MG PO TABS
1.0000 | ORAL_TABLET | Freq: Two times a day (BID) | ORAL | 0 refills | Status: AC
Start: 2019-06-07 — End: ?

## 2019-06-07 NOTE — Telephone Encounter (Signed)
Ok to refill this one time. He has to be evaluated by dentist, if unable to get in he may need to be seen in ER for oral surgery evaluation. Please have him call a dentist with resources we gave. Side effects of prolonged antibiotic use can be C-difficile, diarrhea among many other. If any worsening symptoms seek care immediately. Untreated dental infections can spread to other areas and effect your heart, brain and other organs. Imperative he get treated ASAP and dentist will need to give antibiotics after evaluation if needed.

## 2019-06-07 NOTE — Telephone Encounter (Signed)
Patient states that he finished antibiotic Sunday on Monday he states pain returned, he is still unable to get into a dentist he states until infection has cleared completely so he is requesting another refill. Please advise. KW

## 2019-06-08 NOTE — Telephone Encounter (Signed)
Patient advised and script filled. KW

## 2019-06-12 ENCOUNTER — Encounter: Payer: Medicare Other | Admitting: Family Medicine

## 2019-06-12 DIAGNOSIS — M25552 Pain in left hip: Secondary | ICD-10-CM | POA: Diagnosis not present

## 2019-06-12 DIAGNOSIS — M25551 Pain in right hip: Secondary | ICD-10-CM | POA: Diagnosis not present

## 2019-07-03 DIAGNOSIS — M25552 Pain in left hip: Secondary | ICD-10-CM | POA: Diagnosis not present

## 2019-07-03 DIAGNOSIS — M25551 Pain in right hip: Secondary | ICD-10-CM | POA: Diagnosis not present

## 2019-07-18 DIAGNOSIS — M25552 Pain in left hip: Secondary | ICD-10-CM | POA: Diagnosis not present

## 2019-07-18 DIAGNOSIS — M25551 Pain in right hip: Secondary | ICD-10-CM | POA: Diagnosis not present

## 2019-07-24 DIAGNOSIS — M25552 Pain in left hip: Secondary | ICD-10-CM | POA: Diagnosis not present

## 2019-07-24 DIAGNOSIS — M25551 Pain in right hip: Secondary | ICD-10-CM | POA: Diagnosis not present

## 2019-07-25 ENCOUNTER — Telehealth: Payer: Self-pay | Admitting: Adult Health

## 2019-07-25 NOTE — Telephone Encounter (Signed)
Patient contacted office back and states that we need to speak with Salli Quarry , he states that he has only been to PT 6 times and is scheduled to end on 08/15/19. KW

## 2019-07-25 NOTE — Telephone Encounter (Signed)
Pt has called in after a visit at [facility] yesterday. States that his PT Salli Quarry was wanting him to reach back out to his dr. as they had gotten feedback from [facility] about his treatment and they thought that Flinchum may not understand that he has only had treatment 6 or 7 times. He cannot afford to go to treatment every week. His treatment is due to end in May. He was told that he needed to FU with primary about his frequency of treatment. FU # is [phone] as pt just wanted to leave message as seems funds are short.

## 2019-07-25 NOTE — Telephone Encounter (Signed)
[id number]-KW 

## 2019-07-25 NOTE — Telephone Encounter (Signed)
Left message for physical therapist to call our office back. KW

## 2019-07-25 NOTE — Telephone Encounter (Signed)
Patient does not need to go to orthopedics unless worsening symptoms. Please let him and PT know and I will send another form if needed.

## 2019-07-25 NOTE — Telephone Encounter (Signed)
I contacted [facility] and asked to speak with physical therapist Salli Quarry. He states that he already faxed over form and you had faxed back in response stating that patient needed to go to Ortho. Lorin Picket is saying he instructed patient of this and he was to contact our office to request referral? KW

## 2019-07-25 NOTE — Telephone Encounter (Signed)
Provider does not recall getting any information regarding this. They can refax and I will review. I am okay with him going as he can unless he is experiencing new issues then needs evaluation as soon possible.

## 2019-08-07 DIAGNOSIS — M25551 Pain in right hip: Secondary | ICD-10-CM | POA: Diagnosis not present

## 2019-08-07 DIAGNOSIS — M25552 Pain in left hip: Secondary | ICD-10-CM | POA: Diagnosis not present

## 2019-09-19 NOTE — Progress Notes (Signed)
Subjective:   Charles Harrison is a 39 y.o. male who presents for an Initial Medicare Annual Wellness Visit.  I connected with Ebrahim Deremer today by telephone and verified that I am speaking with the correct person using two identifiers. Location patient: home Location provider: work Persons participating in the virtual visit: patient, provider.   I discussed the limitations, risks, security and privacy concerns of performing an evaluation and management service by telephone and the availability of in person appointments. I also discussed with the patient that there may be a patient responsible charge related to this service. The patient expressed understanding and verbally consented to this telephonic visit.    Interactive audio and video telecommunications were attempted between this provider and patient, however failed, due to patient having technical difficulties OR patient did not have access to video capability.  We continued and completed visit with audio only.  Review of Systems    N/A  Cardiac Risk Factors include: advanced age (>13men, >55 women)     Objective:    Today's Vitals   09/20/19 1128  PainSc: 6    There is no height or weight on file to calculate BMI.  Advanced Directives 09/20/2019 03/25/2019 04/05/2017 04/05/2015  Does Patient Have a Medical Advance Directive? No No No No  Would patient like information on creating a medical advance directive? No - Patient declined - No - Patient declined No - patient declined information    Current Medications (verified) Outpatient Encounter Medications as of 09/20/2019  Medication Sig  . ibuprofen (ADVIL) 800 MG tablet Take 1 tablet (800 mg total) by mouth every 8 (eight) hours as needed.  . meloxicam (MOBIC) 15 MG tablet Take 1 tablet by mouth daily.  Marland Kitchen albuterol (VENTOLIN HFA) 108 (90 Base) MCG/ACT inhaler Inhale 2 puffs into the lungs every 6 (six) hours as needed for wheezing or shortness of breath. (Patient not taking: Reported  on 03/27/2019)  . amoxicillin-clavulanate (AUGMENTIN) 875-125 MG tablet Take 1 tablet by mouth 2 (two) times daily. (Patient not taking: Reported on 09/20/2019)  . cetirizine (ZYRTEC ALLERGY) 10 MG tablet Take 1 tablet (10 mg total) by mouth daily. (Patient not taking: Reported on 09/20/2019)  . fluconazole (DIFLUCAN) 150 MG tablet Take 1 tablet (150 mg total) by mouth as directed. Take one tablet by mouth on day 1. May repeat dose of one tablet by mouth on day four. (Patient not taking: Reported on 09/20/2019)  . fluticasone (FLONASE) 50 MCG/ACT nasal spray Place 1 spray into both nostrils daily. (Patient not taking: Reported on 09/20/2019)   No facility-administered encounter medications on file as of 09/20/2019.    Allergies (verified) Patient has no known allergies.   History: Past Medical History:  Diagnosis Date  . Asthma   . Chronic pain of both hips   . Depression   . Legg-Calve-Perthes disease, right   . OCD (obsessive compulsive disorder)   . Osteoporosis   . PTSD (post-traumatic stress disorder)   . PTSD (post-traumatic stress disorder)    Past Surgical History:  Procedure Laterality Date  . HIP ARTHROSCOPY W/ LABRAL REPAIR     BL   Family History  Problem Relation Age of Onset  . Alcohol abuse Father   . Cancer Paternal Grandmother   . Alcohol abuse Paternal Grandfather   . Cirrhosis Paternal Grandfather    Social History   Socioeconomic History  . Marital status: Married    Spouse name: Not on file  . Number of children: 1  .  Years of education: Not on file  . Highest education level: High school graduate  Occupational History  . Occupation: disability  Tobacco Use  . Smoking status: Current Every Day Smoker    Packs/day: 1.00    Types: Cigarettes    Last attempt to quit: 07/15/2017    Years since quitting: 2.1  . Smokeless tobacco: Never Used  Vaping Use  . Vaping Use: Never used  Substance and Sexual Activity  . Alcohol use: Yes    Alcohol/week: 0.0 -  2.0 standard drinks    Comment: occasionally  . Drug use: Yes    Types: Marijuana  . Sexual activity: Not on file  Other Topics Concern  . Not on file  Social History Narrative   Has 1 step child   Social Determinants of Health   Financial Resource Strain: Low Risk   . Difficulty of Paying Living Expenses: Not hard at all  Food Insecurity: No Food Insecurity  . Worried About Programme researcher, broadcasting/film/videounning Out of Food in the Last Year: Never true  . Ran Out of Food in the Last Year: Never true  Transportation Needs: No Transportation Needs  . Lack of Transportation (Medical): No  . Lack of Transportation (Non-Medical): No  Physical Activity: Inactive  . Days of Exercise per Week: 0 days  . Minutes of Exercise per Session: 0 min  Stress: Stress Concern Present  . Feeling of Stress : Rather much  Social Connections: Moderately Integrated  . Frequency of Communication with Friends and Family: More than three times a week  . Frequency of Social Gatherings with Friends and Family: Twice a week  . Attends Religious Services: More than 4 times per year  . Active Member of Clubs or Organizations: No  . Attends BankerClub or Organization Meetings: Never  . Marital Status: Married    Tobacco Counseling Ready to quit: No Counseling given: No   Clinical Intake:  Pre-visit preparation completed: Yes  Pain : 0-10 Pain Score: 6  Pain Type: Chronic pain Pain Location: Hip (left knee and lower back) Pain Orientation: Right Pain Descriptors / Indicators: Aching Pain Frequency: Constant Pain Relieving Factors: Takes ibuprofen for pain and does PT exercises.  Pain Relieving Factors: Takes ibuprofen for pain and does PT exercises.  Nutritional Risks: Nausea/ vomitting/ diarrhea (Diarrhea occasionally due to being lactose and tolerant.) Diabetes: No  How often do you need to have someone help you when you read instructions, pamphlets, or other written materials from your doctor or pharmacy?: 1 -  Never  Diabetic? No  Interpreter Needed?: No  Information entered by :: [facility] HospitalMmarkoski, LPN   Activities of Daily Living In your present state of health, do you have any difficulty performing the following activities: 09/20/2019 03/27/2019  Hearing? N N  Vision? N N  Difficulty concentrating or making decisions? N N  Walking or climbing stairs? Y Y  Comment Due to hip and knee pains. -  Dressing or bathing? N Y  Doing errands, shopping? N Y  Quarry managerreparing Food and eating ? N -  Using the Toilet? N -  In the past six months, have you accidently leaked urine? N -  Do you have problems with loss of bowel control? N -  Managing your Medications? N -  Managing your Finances? N -  Housekeeping or managing your Housekeeping? Y -  Comment Wife assists with house work. -  Some recent data might be hidden    Patient Care Team: Flinchum, Eula FriedMichelle S, FNP as PCP - General (  Family Medicine)  Indicate any recent Medical Services you may have received from other than [facility] providers in the past year (date may be approximate).     Assessment:   This is a routine wellness examination for Ramondo.  Hearing/Vision screen No exam data present  Dietary issues and exercise activities discussed: Current Exercise Habits: Home exercise routine, Type of exercise: stretching;Other - see comments (PT exercises), Time (Minutes): 20, Frequency (Times/Week): 7, Weekly Exercise (Minutes/Week): 140, Intensity: Mild, Exercise limited by: orthopedic condition(s)  Goals    . Prevent falls     Recommend to remove any items from the home that may cause slips or trips.    . Quit Smoking     Recommend to continue efforts to reduce smoking habits until no longer smoking.       Depression Screen PHQ 2/9 Scores 09/20/2019 03/27/2019 03/27/2019 06/10/2018  PHQ - 2 Score 0 2 1 0  PHQ- 9 Score - - 9 -    Fall Risk Fall Risk  09/20/2019 03/27/2019  Falls in the past year? 1 1  Number falls in past yr: 1 1  Injury with Fall?  0 1  Risk for fall due to : Impaired mobility -    Any stairs in or around the home? Yes  If so, are there any without handrails? No  Home free of loose throw rugs in walkways, pet beds, electrical cords, etc? Yes  Adequate lighting in your home to reduce risk of falls? Yes   ASSISTIVE DEVICES UTILIZED TO PREVENT FALLS:  Life alert? No  Use of a cane, walker or w/c? Yes  Grab bars in the bathroom? No  Shower chair or bench in shower? No  Elevated toilet seat or a handicapped toilet? No    Cognitive Function:     6CIT Screen 09/20/2019  What Year? 0 points  What month? 0 points  What time? 0 points  Count back from 20 0 points  Months in reverse 0 points  Repeat phrase 0 points  Total Score 0    Immunizations  There is no immunization history on file for this patient.  TDAP status: Up to date Flu Vaccine status: Due fall 2021 Covid-19 vaccine status: Declined, Education has been provided regarding the importance of this vaccine but patient still declined. Advised may receive this vaccine at local pharmacy or Health Dept.or vaccine clinic. Aware to provide a copy of the vaccination record if obtained from local pharmacy or Health Dept. Verbalized acceptance and understanding.  Qualifies for Shingles Vaccine? No     Screening Tests Health Maintenance  Topic Date Due  . Hepatitis C Screening  Never done  . HIV Screening  Never done  . COVID-19 Vaccine (1) 10/06/2019 (Originally 11/16/1992)  . INFLUENZA VACCINE  10/22/2019  . TETANUS/TDAP  03/23/2022    Health Maintenance  Health Maintenance Due  Topic Date Due  . Hepatitis C Screening  Never done  . HIV Screening  Never done   Lung Cancer Screening: (Low Dose CT Chest recommended if Age 41-80 years, 30 pack-year currently smoking OR have quit w/in 15years.) does not qualify.   Additional Screening:  Vision Screening: Recommended annual ophthalmology exams for early detection of glaucoma and other disorders of  the eye. Is the patient up to date with their annual eye exam?  No, pt declined. Who is the provider or what is the name of the office in which the patient attends annual eye exams? N/A If pt is not established with  a provider, would they like to be referred to a provider to establish care? No .   Dental Screening: Recommended annual dental exams for proper oral hygiene  Community Resource Referral / Chronic Care Management: CRR required this visit?  No   CCM required this visit?  No      Plan:     I have personally reviewed and noted the following in the patient's chart:   . Medical and social history . Use of alcohol, tobacco or illicit drugs  . Current medications and supplements . Functional ability and status . Nutritional status . Physical activity . Advanced directives . List of other physicians . Hospitalizations, surgeries, and ER visits in previous 12 months . Vitals . Screenings to include cognitive, depression, and falls . Referrals and appointments  In addition, I have reviewed and discussed with patient certain preventive protocols, quality metrics, and best practice recommendations. A written personalized care plan for preventive services as well as general preventive health recommendations were provided to patient.     Sanayah Munro [place], [place]   [id number]   Nurse Notes: Pt agreed to have the Hep C and HIV lab screenings at next in office apt.

## 2019-09-20 ENCOUNTER — Ambulatory Visit (INDEPENDENT_AMBULATORY_CARE_PROVIDER_SITE_OTHER): Payer: Medicare Other

## 2019-09-20 ENCOUNTER — Other Ambulatory Visit: Payer: Self-pay

## 2019-09-20 DIAGNOSIS — Z0000 Encounter for general adult medical examination without abnormal findings: Secondary | ICD-10-CM

## 2019-09-20 NOTE — Patient Instructions (Signed)
Mr. Charles Harrison , Thank you for taking time to come for your Medicare Wellness Visit. I appreciate your ongoing commitment to your health goals. Please review the following plan we discussed and let me know if I can assist you in the future.   Screening recommendations/referrals: Recommended yearly ophthalmology/optometry visit for glaucoma screening and checkup Recommended yearly dental visit for hygiene and checkup  Vaccinations: Influenza vaccine: Due fall 2021 Tdap vaccine: Currently due, declined today.   Advanced directives: Advance directive discussed with you today. Even though you declined this today please call our office should you change your mind and we can give you the proper paperwork for you to fill out.  Conditions/risks identified: Fall risk preventatives and smoking cessation discussed today.   Next appointment: None, declined scheduling a follow up with PCP or an AWV for 2022 at this time.  Preventive Care 40-64 Years, Male Preventive care refers to lifestyle choices and visits with your health care provider that can promote health and wellness. What does preventive care include?  A yearly physical exam. This is also called an annual well check.  Dental exams once or twice a year.  Routine eye exams. Ask your health care provider how often you should have your eyes checked.  Personal lifestyle choices, including:  Daily care of your teeth and gums.  Regular physical activity.  Eating a healthy diet.  Avoiding tobacco and drug use.  Limiting alcohol use.  Practicing safe sex.  Taking low-dose aspirin every day starting at age 78. What happens during an annual well check? The services and screenings done by your health care provider during your annual well check will depend on your age, overall health, lifestyle risk factors, and family history of disease. Counseling  Your health care provider may ask you questions about your:  Alcohol use.  Tobacco  use.  Drug use.  Emotional well-being.  Home and relationship well-being.  Sexual activity.  Eating habits.  Work and work Astronomer. Screening  You may have the following tests or measurements:  Height, weight, and BMI.  Blood pressure.  Lipid and cholesterol levels. These may be checked every 5 years, or more frequently if you are over 62 years old.  Skin check.  Lung cancer screening. You may have this screening every year starting at age 70 if you have a 30-pack-year history of smoking and currently smoke or have quit within the past 15 years.  Fecal occult blood test (FOBT) of the stool. You may have this test every year starting at age 70.  Flexible sigmoidoscopy or colonoscopy. You may have a sigmoidoscopy every 5 years or a colonoscopy every 10 years starting at age 73.  Prostate cancer screening. Recommendations will vary depending on your family history and other risks.  Hepatitis C blood test.  Hepatitis B blood test.  Sexually transmitted disease (STD) testing.  Diabetes screening. This is done by checking your blood sugar (glucose) after you have not eaten for a while (fasting). You may have this done every 1-3 years. Discuss your test results, treatment options, and if necessary, the need for more tests with your health care provider. Vaccines  Your health care provider may recommend certain vaccines, such as:  Influenza vaccine. This is recommended every year.  Tetanus, diphtheria, and acellular pertussis (Tdap, Td) vaccine. You may need a Td booster every 10 years.  Zoster vaccine. You may need this after age 73.  Pneumococcal 13-valent conjugate (PCV13) vaccine. You may need this if you have certain conditions  and have not been vaccinated.  Pneumococcal polysaccharide (PPSV23) vaccine. You may need one or two doses if you smoke cigarettes or if you have certain conditions. Talk to your health care provider about which screenings and vaccines you  need and how often you need them. This information is not intended to replace advice given to you by your health care provider. Make sure you discuss any questions you have with your health care provider. Document Released: 04/05/2015 Document Revised: 11/27/2015 Document Reviewed: 01/08/2015 Elsevier Interactive Patient Education  2017 [employer].  Fall Prevention in the Home Falls can cause injuries. They can happen to people of all ages. There are many things you can do to make your home safe and to help prevent falls. What can I do on the outside of my home?  Regularly fix the edges of walkways and driveways and fix any cracks.  Remove anything that might make you trip as you walk through a door, such as a raised step or threshold.  Trim any bushes or trees on the path to your home.  Use bright outdoor lighting.  Clear any walking paths of anything that might make someone trip, such as rocks or tools.  Regularly check to see if handrails are loose or broken. Make sure that both sides of any steps have handrails.  Any raised decks and porches should have guardrails on the edges.  Have any leaves, snow, or ice cleared regularly.  Use sand or salt on walking paths during winter.  Clean up any spills in your garage right away. This includes oil or grease spills. What can I do in the bathroom?  Use night lights.  Install grab bars by the toilet and in the tub and shower. Do not use towel bars as grab bars.  Use non-skid mats or decals in the tub or shower.  If you need to sit down in the shower, use a plastic, non-slip stool.  Keep the floor dry. Clean up any water that spills on the floor as soon as it happens.  Remove soap buildup in the tub or shower regularly.  Attach bath mats securely with double-sided non-slip rug tape.  Do not have throw rugs and other things on the floor that can make you trip. What can I do in the bedroom?  Use night lights.  Make sure  that you have a light by your bed that is easy to reach.  Do not use any sheets or blankets that are too big for your bed. They should not hang down onto the floor.  Have a firm chair that has side arms. You can use this for support while you get dressed.  Do not have throw rugs and other things on the floor that can make you trip. What can I do in the kitchen?  Clean up any spills right away.  Avoid walking on wet floors.  Keep items that you use a lot in easy-to-reach places.  If you need to reach something above you, use a strong step stool that has a grab bar.  Keep electrical cords out of the way.  Do not use floor polish or wax that makes floors slippery. If you must use wax, use non-skid floor wax.  Do not have throw rugs and other things on the floor that can make you trip. What can I do with my stairs?  Do not leave any items on the stairs.  Make sure that there are handrails on both sides of the stairs  and use them. Fix handrails that are broken or loose. Make sure that handrails are as long as the stairways.  Check any carpeting to make sure that it is firmly attached to the stairs. Fix any carpet that is loose or worn.  Avoid having throw rugs at the top or bottom of the stairs. If you do have throw rugs, attach them to the floor with carpet tape.  Make sure that you have a light switch at the top of the stairs and the bottom of the stairs. If you do not have them, ask someone to add them for you. What else can I do to help prevent falls?  Wear shoes that:  Do not have high heels.  Have rubber bottoms.  Are comfortable and fit you well.  Are closed at the toe. Do not wear sandals.  If you use a stepladder:  Make sure that it is fully opened. Do not climb a closed stepladder.  Make sure that both sides of the stepladder are locked into place.  Ask someone to hold it for you, if possible.  Clearly mark and make sure that you can see:  Any grab bars or  handrails.  First and last steps.  Where the edge of each step is.  Use tools that help you move around (mobility aids) if they are needed. These include:  Canes.  Walkers.  Scooters.  Crutches.  Turn on the lights when you go into a dark area. Replace any light bulbs as soon as they burn out.  Set up your furniture so you have a clear path. Avoid moving your furniture around.  If any of your floors are uneven, fix them.  If there are any pets around you, be aware of where they are.  Review your medicines with your doctor. Some medicines can make you feel dizzy. This can increase your chance of falling. Ask your doctor what other things that you can do to help prevent falls. This information is not intended to replace advice given to you by your health care provider. Make sure you discuss any questions you have with your health care provider. Document Released: 01/03/2009 Document Revised: 08/15/2015 Document Reviewed: 04/13/2014 Elsevier Interactive Patient Education  2017 [employer].

## 2020-12-16 IMAGING — US US SCROTUM W/ DOPPLER COMPLETE
1 series · 13 of 25 positions shown · non-contrast
Comparison: None.

CLINICAL DATA: Palpable lump

EXAM:
SCROTAL ULTRASOUND
DOPPLER ULTRASOUND OF THE TESTICLES
TECHNIQUE: Complete ultrasound examination of the testicles, epididymis, and
other scrotal structures was performed. Color and spectral Doppler
ultrasound were also utilized to evaluate blood flow to the
testicles.

[Series 1: us scrotum w/ doppler complete · 13 of 39 slices shown]
[im 1/39]
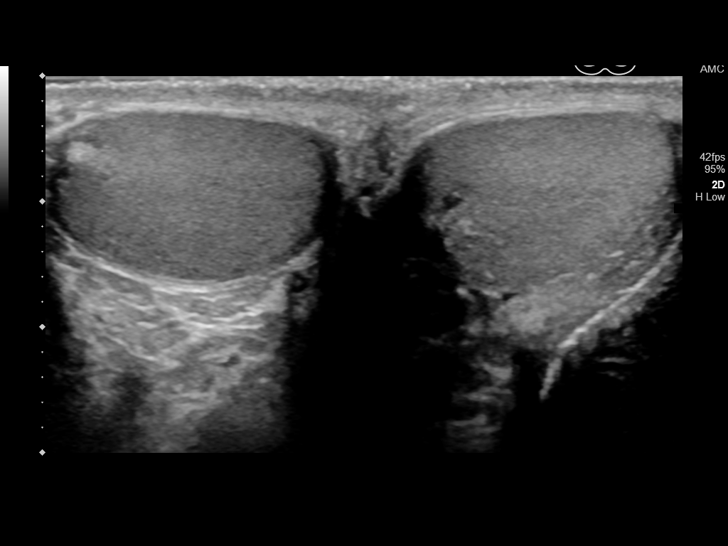
[im 4/39]
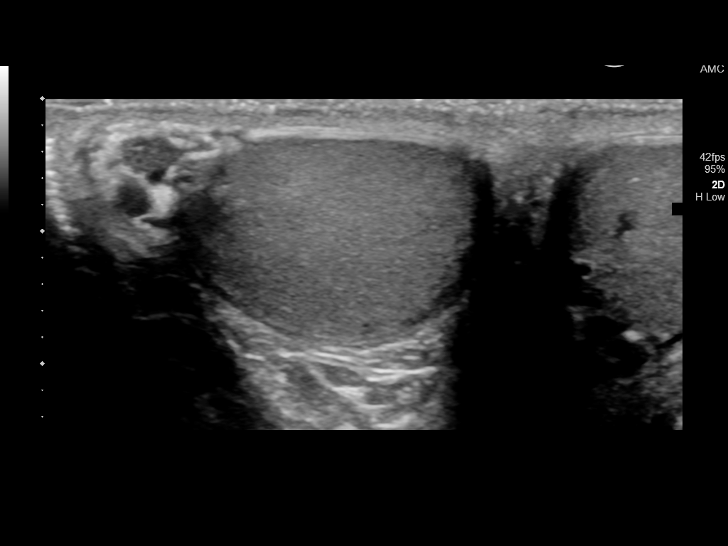
[im 7/39]
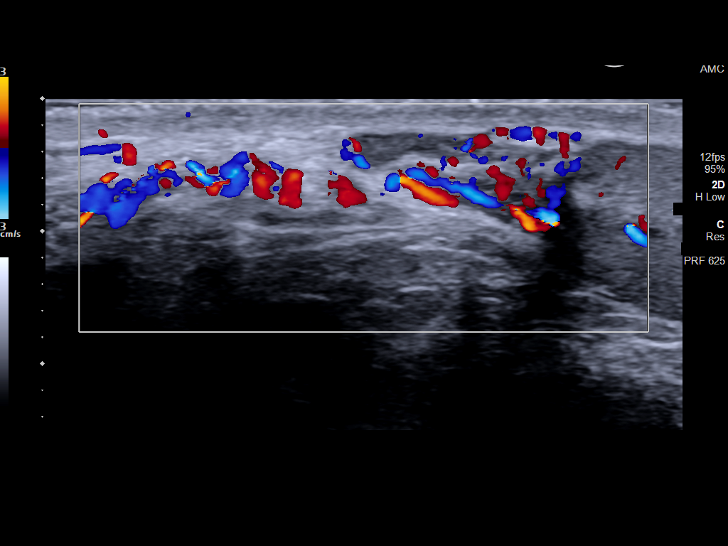
[im 10/39]
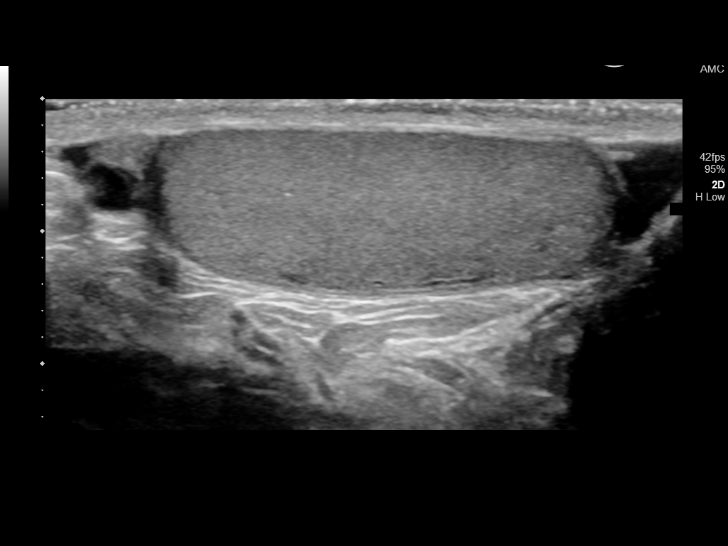
[im 13/39]
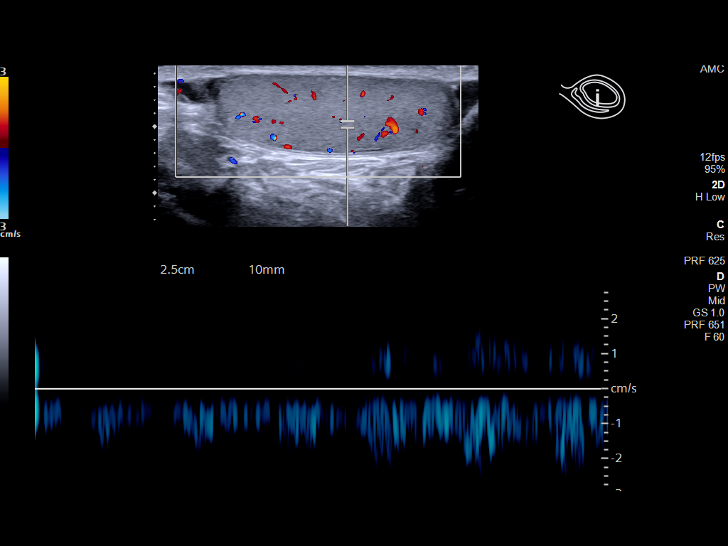
[im 16/39]
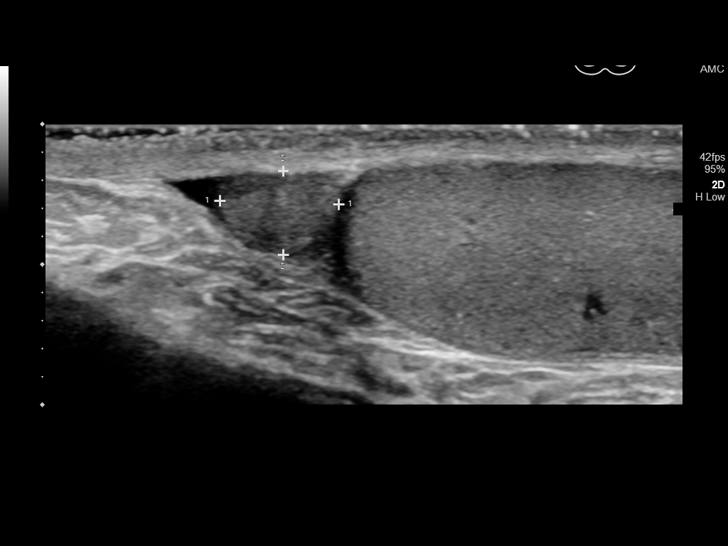
[im 20/39]
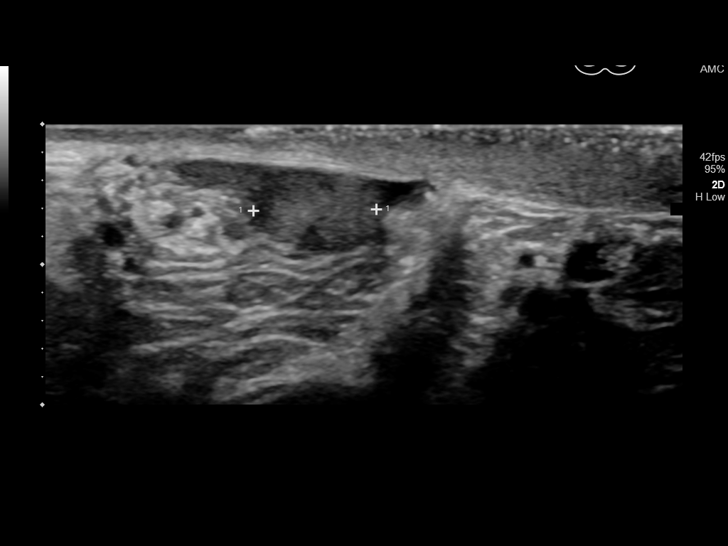
[im 23/39]
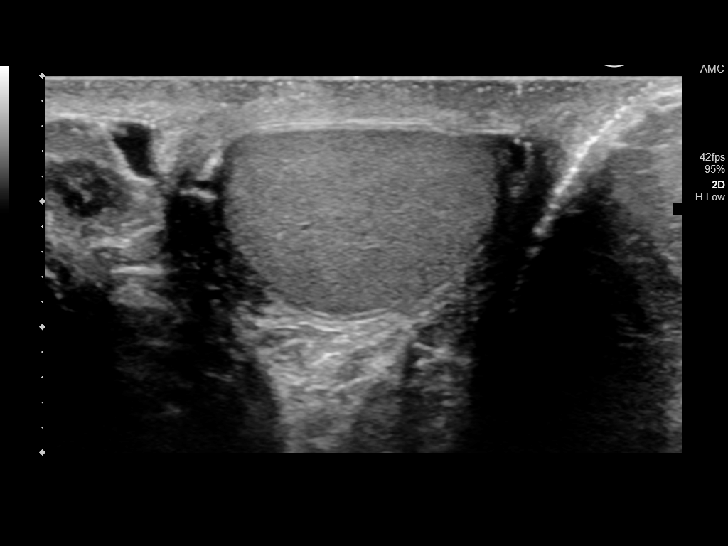
[im 26/39]
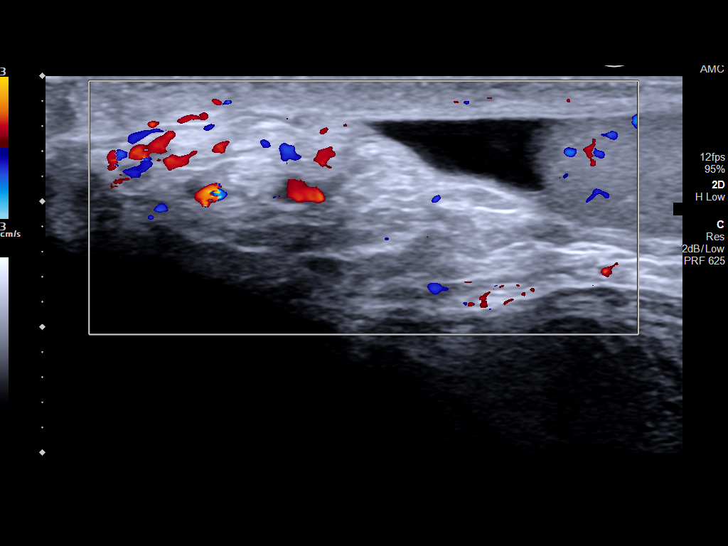
[im 29/39]
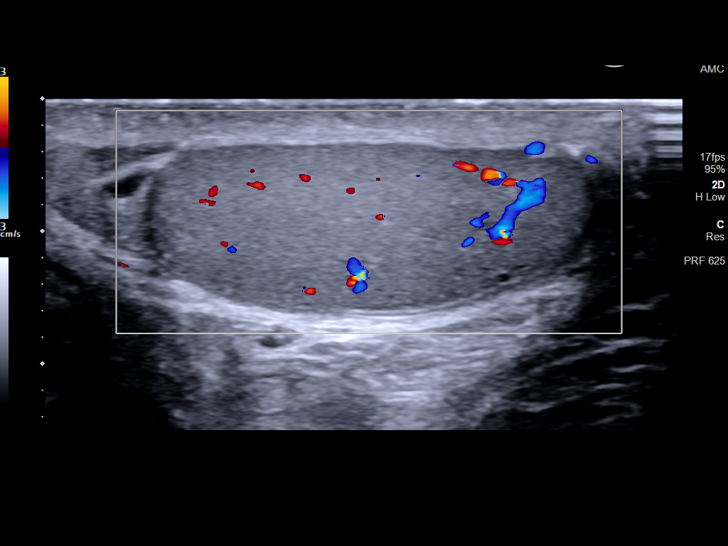
[im 32/39]
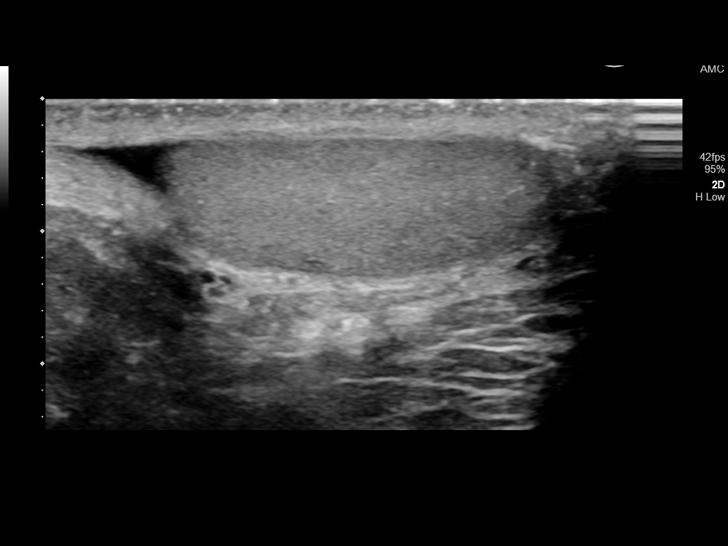
[im 35/39]
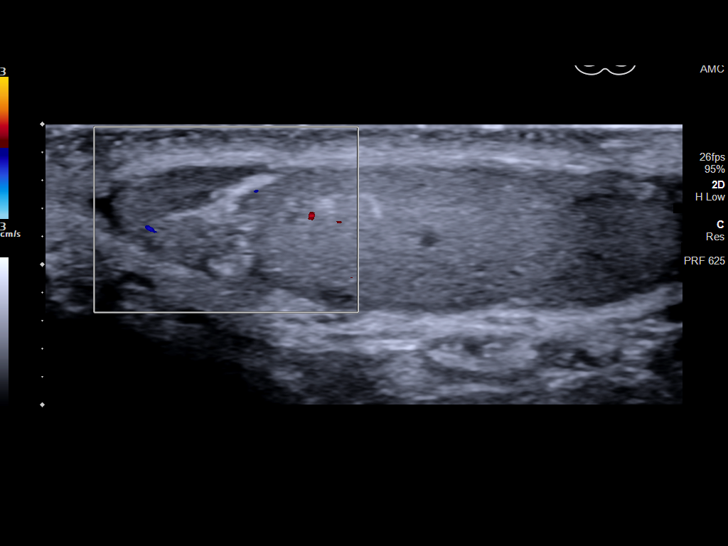
[im 39/39]
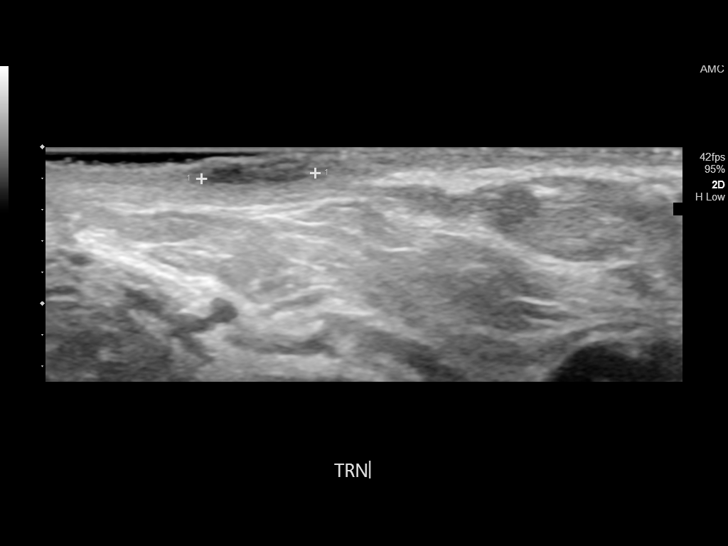

[13 of 25 positions shown; findings below may reference images not displayed]

FINDINGS: Right testicle

Measurements: 3.6 x 1.2 x 2.1 cm. No mass or microlithiasis
visualized.

Left testicle

Measurements: 3.2 x 1.3 x 2.2 cm. No mass or microlithiasis
visualized.

Right epididymis: Normal in size and appearance. Incidental note of
6 mm epididymal head cyst.

Left epididymis:  Normal in size and appearance.

Hydrocele:  Trace bilateral hydroceles.

Varicocele:  None visualized.

Pulsed Doppler interrogation of both testes demonstrates normal low
resistance arterial and venous waveforms bilaterally.

Other: Within the cutaneous tissues of the left hemiscrotum at area
of patient's clinical concern is a elongated ovoid area of
hypoechogenicity measuring 1.3 x 0.2 x 0.7 cm. No internal color
flow. No surrounding edema or hyperemia. No scrotal wall thickening.
IMPRESSION: 1. Negative for testicular torsion or intratesticular mass.
2. Area of patient's palpable abnormality corresponds to a
well-circumscribed ovoid hypoechoic area within the superficial
cutaneous soft tissues of the left hemiscrotum, possibly
representing a sebaceous or epidermoid cyst. No surrounding edema or
hyperemia to suggest infection or inflammation. Correlate with
physical exam.
3. Trace bilateral hydroceles.
